# Patient Record
Sex: Female | Born: 1990 | Race: White | Hispanic: No | Marital: Single | Smoking: Former smoker
Health system: Southern US, Community
[De-identification: ages and names within clinical notes are randomized; demographics above are authoritative.]

## PROBLEM LIST (undated history)

## (undated) DIAGNOSIS — K589 Irritable bowel syndrome without diarrhea: Secondary | ICD-10-CM

## (undated) DIAGNOSIS — F419 Anxiety disorder, unspecified: Secondary | ICD-10-CM

## (undated) DIAGNOSIS — L309 Dermatitis, unspecified: Secondary | ICD-10-CM

## (undated) DIAGNOSIS — T783XXA Angioneurotic edema, initial encounter: Secondary | ICD-10-CM

## (undated) DIAGNOSIS — J45909 Unspecified asthma, uncomplicated: Secondary | ICD-10-CM

## (undated) DIAGNOSIS — F32A Depression, unspecified: Secondary | ICD-10-CM

## (undated) DIAGNOSIS — F329 Major depressive disorder, single episode, unspecified: Secondary | ICD-10-CM

## (undated) DIAGNOSIS — L659 Nonscarring hair loss, unspecified: Secondary | ICD-10-CM

## (undated) DIAGNOSIS — Z3201 Encounter for pregnancy test, result positive: Secondary | ICD-10-CM

## (undated) DIAGNOSIS — F17200 Nicotine dependence, unspecified, uncomplicated: Secondary | ICD-10-CM

## (undated) HISTORY — DX: Angioneurotic edema, initial encounter: T78.3XXA

## (undated) HISTORY — DX: Dermatitis, unspecified: L30.9

## (undated) HISTORY — PX: WISDOM TOOTH EXTRACTION: SHX21

## (undated) HISTORY — DX: Unspecified asthma, uncomplicated: J45.909

---

## 2013-04-22 ENCOUNTER — Encounter (HOSPITAL_COMMUNITY): Payer: Self-pay | Admitting: *Deleted

## 2013-04-22 ENCOUNTER — Emergency Department (HOSPITAL_COMMUNITY): Payer: Managed Care, Other (non HMO)

## 2013-04-22 ENCOUNTER — Emergency Department (HOSPITAL_COMMUNITY)
Admission: EM | Admit: 2013-04-22 | Discharge: 2013-04-23 | Disposition: A | Discharge status: Home / Self Care | Payer: Managed Care, Other (non HMO) | Attending: Emergency Medicine | Admitting: Emergency Medicine

## 2013-04-22 DIAGNOSIS — IMO0002 Reserved for concepts with insufficient information to code with codable children: Secondary | ICD-10-CM | POA: Insufficient documentation

## 2013-04-22 DIAGNOSIS — J40 Bronchitis, not specified as acute or chronic: Secondary | ICD-10-CM | POA: Insufficient documentation

## 2013-04-22 DIAGNOSIS — Z8739 Personal history of other diseases of the musculoskeletal system and connective tissue: Secondary | ICD-10-CM | POA: Insufficient documentation

## 2013-04-22 DIAGNOSIS — Z3202 Encounter for pregnancy test, result negative: Secondary | ICD-10-CM | POA: Insufficient documentation

## 2013-04-22 DIAGNOSIS — R062 Wheezing: Secondary | ICD-10-CM

## 2013-04-22 DIAGNOSIS — R05 Cough: Secondary | ICD-10-CM

## 2013-04-22 DIAGNOSIS — Z880 Allergy status to penicillin: Secondary | ICD-10-CM | POA: Insufficient documentation

## 2013-04-22 MED ORDER — IPRATROPIUM BROMIDE 0.02 % IN SOLN
0.5000 mg | Freq: Once | RESPIRATORY_TRACT | Status: AC
  Administered 2013-04-22: 0.5 mg via RESPIRATORY_TRACT
  Filled 2013-04-22: qty 2.5

## 2013-04-22 MED ORDER — ALBUTEROL SULFATE HFA 108 (90 BASE) MCG/ACT IN AERS: 2.0000 | INHALATION_SPRAY | RESPIRATORY_TRACT | Status: DC | PRN

## 2013-04-22 MED ORDER — BENZONATATE 100 MG PO CAPS: 100.0000 mg | ORAL_CAPSULE | Freq: Two times a day (BID) | ORAL | Status: DC | PRN

## 2013-04-22 MED ORDER — ALBUTEROL SULFATE (5 MG/ML) 0.5% IN NEBU
5.0000 mg | INHALATION_SOLUTION | Freq: Once | RESPIRATORY_TRACT | Status: AC
  Administered 2013-04-22: 5 mg via RESPIRATORY_TRACT
  Filled 2013-04-22: qty 1

## 2013-04-22 MED ORDER — BENZONATATE 100 MG PO CAPS
200.0000 mg | ORAL_CAPSULE | Freq: Once | ORAL | Status: AC
  Administered 2013-04-22: 200 mg via ORAL
  Filled 2013-04-22: qty 2

## 2013-04-22 NOTE — ED Notes (Signed)
Pt c/o SOB, Pts PCP treating her for last two day with bronchitis, PCP told Pt if symptoms didn't improve to come to ER.Brianna KitchenMarland Wilkinson

## 2013-04-22 NOTE — ED Provider Notes (Signed)
CSN: [id number]     Arrival date & time 04/22/13  2138 History   First MD Initiated Contact with Patient 04/22/13 2209     Chief Complaint  Patient presents with  . Shortness of Breath  . Cough   (Consider location/radiation/quality/duration/timing/severity/associated sxs/prior Treatment) Patient is a 22 y.o. female presenting with cough. The history is provided by the patient.  Cough Cough characteristics:  Non-productive, hoarse and harsh Severity:  Severe Onset quality:  Gradual Duration:  3 days Timing:  Constant Progression:  Worsening Chronicity:  New Context: upper respiratory infection   Relieved by:  Nothing Worsened by:  Deep breathing Ineffective treatments:  Cough suppressants (mucinex and throat menthol spray) Associated symptoms: shortness of breath and wheezing   Associated symptoms: no chest pain, no fever, no myalgias, no rash and no sore throat   Risk factors: recent infection (treated with antibiotics and prednisone taper by PCP)     Past Medical History  Diagnosis Date  . Arthritis    History reviewed. No pertinent past surgical history. No family history on file. History  Substance Use Topics  . Smoking status: Never Smoker   . Smokeless tobacco: Not on file  . Alcohol Use: No   OB History   Grav Para Term Preterm Abortions TAB SAB Ect Mult Living                 Review of Systems  Constitutional: Negative for fever, activity change and appetite change.  HENT: Negative for sore throat.   Respiratory: Positive for cough, shortness of breath and wheezing. Negative for chest tightness.   Cardiovascular: Negative for chest pain.  Gastrointestinal: Negative for nausea, vomiting, diarrhea and constipation.  Genitourinary: Negative for dysuria and difficulty urinating.  Musculoskeletal: Negative for myalgias.  Skin: Negative for color change, pallor, rash and wound.  All other systems reviewed and are negative.    Allergies  Pork-derived  products; Other; and Penicillins  Home Medications   Current Outpatient Rx  Name  Route  Sig  Dispense  Refill  . chlorpheniramine-HYDROcodone (TUSSIONEX) 10-8 MG/5ML LQCR   Oral   Take 5 mLs by mouth every 12 (twelve) hours as needed.         Marland Kitchen escitalopram (LEXAPRO) 10 MG tablet   Oral   Take 10 mg by mouth daily.         . Norgestimate-Ethinyl Estradiol Triphasic (TRINESSA, 28,) 0.18/0.215/0.25 MG-35 MCG tablet   Oral   Take 1 tablet by mouth daily.         . predniSONE (STERAPRED UNI-PAK) 10 MG tablet   Oral   Take 10 mg by mouth daily. 6,5,4,3,2,1         . sulfamethoxazole-trimethoprim (BACTRIM DS,SEPTRA DS) 800-160 MG per tablet   Oral   Take 1 tablet by mouth 2 (two) times daily. Started 04/22/13, for 7 days, ending 04/27/13         . albuterol (PROVENTIL HFA;VENTOLIN HFA) 108 (90 BASE) MCG/ACT inhaler   Inhalation   Inhale 2 puffs into the lungs every 4 (four) hours as needed for wheezing.   1 Inhaler   0   . benzonatate (TESSALON) 100 MG capsule   Oral   Take 1 capsule (100 mg total) by mouth 2 (two) times daily as needed for cough.   10 capsule   0    BP 114/56  Pulse 80  Temp(Src) 97.4 F (36.3 C) (Oral)  Resp 18  SpO2 95%  LMP 04/09/2013 Physical Exam  Nursing  note and vitals reviewed. Constitutional: She is oriented to person, place, and time. She appears well-developed and well-nourished. No distress.  HENT:  Head: Normocephalic and atraumatic.  Mouth/Throat: Oropharynx is clear and moist. No oropharyngeal exudate.  Eyes: Conjunctivae and EOM are normal. Pupils are equal, round, and reactive to light.  Neck: Normal range of motion. Neck supple.  Cardiovascular: Normal rate, regular rhythm and normal heart sounds.  Exam reveals no gallop and no friction rub.   No murmur heard. Pulmonary/Chest: Effort normal and breath sounds normal. No respiratory distress. She has no wheezes. She has no rales. She exhibits no tenderness.  Abdominal:  Soft. She exhibits no distension. There is no tenderness.  Musculoskeletal: Normal range of motion. She exhibits no edema and no tenderness.  Lymphadenopathy:    She has no cervical adenopathy.  Neurological: She is alert and oriented to person, place, and time.  Skin: Skin is warm and dry. No rash noted. She is not diaphoretic.  Psychiatric: She has a normal mood and affect. Her behavior is normal. Judgment and thought content normal.    ED Course  Procedures (including critical care time) Labs Review Labs Reviewed  POCT PREGNANCY, URINE   Imaging Review Dg Chest 2 View  04/22/2013   *RADIOLOGY REPORT*  Clinical Data: Shortness of breath and persistent cough.  CHEST - 2 VIEW  Comparison: None.  Findings: The lungs are well-aerated.  Minimal bibasilar opacities likely reflect atelectasis.  There is no evidence of pleural effusion or pneumothorax.  The heart is normal in size; the mediastinal contour is within normal limits.  No acute osseous abnormalities are seen.  IMPRESSION: Minimal bibasilar opacities likely reflect atelectasis; lungs otherwise clear.   Original Report Authenticated By: Tonia Ghent, M.D.    MDM   1. Bronchitis   2. Wheezing   3. Cough      Date: 04/22/2013  Rate: 84  Rhythm: normal sinus rhythm  QRS Axis: normal  Intervals: normal  ST/T Wave abnormalities: normal  Conduction Disutrbances:none  Narrative Interpretation:   Old EKG Reviewed: none available  The patient is a 22 year old female with a history of asthma as a child which has since resolved who presents with 2 days of worsening dyspnea, cough, headache. She was seen by her PCP 2 days ago with a chest x-ray with unknown results. She was started on a present taper as well as an unknown antibiotic. He continued dyspnea cough, she presents tonight with worsening symptoms. He states that she can only take shallow breaths because when she takes deep breaths she begins coughing which makes her catch her  breath. Denies fever, chest pain, nausea, vomiting.   On exam, moderate diffuse wheezing with decreased breath sounds and harsh bronchitis cough. No hypoxia and vital signs stable. PERC negative and doubt PE. Afebrile and no signs of pneumonia. Will evaluate with chest x-ray, EKG. Will give Tessalon for cough. Will treat with a breathing treatment per RT to attempt to improve breathing.  CXR shows no consolidation, effusion, pneumothorax, or widened mediastinum. EKG reassuring. Following duoneb, breathing much improved and wheezing completely resolved. Feel that bronchitis with bronchospasm most likely. Will treat going home with tessalon as well as continued Mucinex DM. Albuterol script given for wheezing PRN. Hemodynamically stable and otherwise okay for discharge. PCP follow up.  Discussed with the patient return precautions and need for follow up with PCP. Patient voiced understanding. Stable for d/c. This patient was discussed with my attending, Dr. Redgie Grayer.    Dorna Leitz,  MD 04/22/13 2348

## 2013-04-22 NOTE — Discharge Instructions (Signed)
Bronchitis Bronchitis is the body's way of reacting to injury and/or infection (inflammation) of the bronchi. Bronchi are the air tubes that extend from the windpipe into the lungs. If the inflammation becomes severe, it may cause shortness of breath. CAUSES  Inflammation may be caused by:  A virus.  Germs (bacteria).  Dust.  Allergens.  Pollutants and many other irritants. The cells lining the bronchial tree are covered with tiny hairs (cilia). These constantly beat upward, away from the lungs, toward the mouth. This keeps the lungs free of pollutants. When these cells become too irritated and are unable to do their job, mucus begins to develop. This causes the characteristic cough of bronchitis. The cough clears the lungs when the cilia are unable to do their job. Without either of these protective mechanisms, the mucus would settle in the lungs. Then you would develop pneumonia. Smoking is a common cause of bronchitis and can contribute to pneumonia. Stopping this habit is the single most important thing you can do to help yourself. TREATMENT   Your caregiver may prescribe an antibiotic if the cough is caused by bacteria. Also, medicines that open up your airways make it easier to breathe. Your caregiver may also recommend or prescribe an expectorant. It will loosen the mucus to be coughed up. Only take over-the-counter or prescription medicines for pain, discomfort, or fever as directed by your caregiver.  Removing whatever causes the problem (smoking, for example) is critical to preventing the problem from getting worse.  Cough suppressants may be prescribed for relief of cough symptoms.  Inhaled medicines may be prescribed to help with symptoms now and to help prevent problems from returning.  For those with recurrent (chronic) bronchitis, there may be a need for steroid medicines. SEEK IMMEDIATE MEDICAL CARE IF:   During treatment, you develop more pus-like mucus (purulent  sputum).  You have a fever.  Your baby is older than 3 months with a rectal temperature of 102 F (38.9 C) or higher.  Your baby is 28 months old or younger with a rectal temperature of 100.4 F (38 C) or higher.  You become progressively more ill.  You have increased difficulty breathing, wheezing, or shortness of breath. It is necessary to seek immediate medical care if you are elderly or sick from any other disease. MAKE SURE YOU:   Understand these instructions.  Will watch your condition.  Will get help right away if you are not doing well or get worse. Document Released: 07/05/2005 Document Revised: 09/27/2011 Document Reviewed: 05/14/2008 [facility] Patient Information 2014 [place], [place].  Metered Dose Inhaler with Spacer Inhaled medicines are the basis of treatment of asthma and other breathing problems. Inhaled medicine can only be effective if used properly. Good technique assures that the medicine reaches the lungs. Your caregiver has asked you to use a spacer with your inhaler. A spacer is a plastic tube with a mouthpiece on one end and an opening that connects to the inhaler on the other end. A spacer helps you take the medicine better. Metered dose inhalers (MDIs) are used to deliver a variety of inhaled medicines. These include quick relief medicines, controller medicines (such as corticosteroids), and cromolyn. The medicine is delivered by pushing down on a metal canister to release a set amount of spray. If you are using different kinds of inhalers, use your quick relief medicine to open the airways 10 to 15 minutes before using a steroid. If you are unsure which inhalers to use and the order of  using them, ask your caregiver, nurse, or respiratory therapist. STEPS TO FOLLOW USING AN INHALER WITH AN EXTENSION (SPACER): 1. Remove cap from inhaler. 2. Shake inhaler for 5 seconds before each inhalation (breathing in). 3. Place the open end of the spacer onto the mouthpiece  of the inhaler. 4. Position the inhaler so that the top of the canister faces up and the spacer mouthpiece faces you. 5. Put your index finger on the top of the medication canister. Your thumb supports the bottom of the inhaler and the spacer. 6. Exhale (breathe out) normally and as completely as possible. 7. Immediately after exhaling, place the spacer between your teeth and into your mouth. Close your mouth tightly around the spacer. 8. Press the canister down with the index finger to release the medication. 9. At the same time as the canister is pressed, inhale deeply and slowly until the lungs are completely filled. This should take 4 to 6 seconds. Keep your tongue down and out of the way. 10. Hold the medication in your lungs for up to 10 seconds (10 seconds is best). This helps the medicine get into the small airways of your lungs to work better. Exhale. 11. Repeat inhaling deeply through the spacer mouthpiece. Again hold that breath for up to 10 seconds (10 seconds is best). Exhale slowly. If it is difficult to take this second deep breath through the spacer, breathe normally several times through the spacer. Remove the spacer from your mouth. 12. Wait at least 1 minute between puffs. Continue with the above steps until you have taken the number of puffs your caregiver has ordered. 13. Remove spacer from the inhaler and place cap on inhaler. If you are using a steroid inhaler, rinse your mouth with water after your last puff and then spit out the water. DO NOT swallow the water. AVOID:  Inhaling before or after starting the spray of medicine. It takes practice to coordinate your breathing with triggering the spray.  Inhaling through the nose (rather than the mouth) when triggering the spray. HOW TO DETERMINE IF YOUR INHALER IS FULL OR NEARLY EMPTY:  Determine when an inhaler is empty. You cannot know when an inhaler is empty by shaking it. A few inhalers are now being made with dose  counters. Ask your caregiver for a prescription that has a dose counter if you feel you need that extra help.  If your inhaler does not have a counter, check the number of doses in the inhaler before you use it. The canister or box will list the number of doses in the canister. Divide the total number of doses in the canister by the number you will use each day to find how many days the canister will last. (For example, if your canister has 200 doses and you take 2 puffs, 4 times each day, which is 8 puffs a day. Dividing 200 by 8 equals 25. The canister should last 25 days.) Using a calendar, count forward that many days to see when your inhaler will run out. Write the refill date on a calendar or your canister.  Remember, if you need to take extra doses, the inhaler will empty sooner than you figured. Be sure you have a refill before your canister runs out. Refill your inhaler 7 to 10 days before it runs out. HOME CARE INSTRUCTIONS   Do not use the inhaler more than your caregiver tells you. If you are still wheezing and are feeling tightness in your chest, call your  caregiver.  Keep an adequate supply of medication. This includes making sure the medicine is not expired, and you have a spare inhaler.  Follow your caregiver or inhaler insert directions for cleaning the inhaler and spacer. SEEK MEDICAL CARE IF:   Symptoms are only partially relieved with your inhaler.  You are having trouble using your inhaler.  You experience some increase in phlegm.  You develop a fever of 102 F (38.9 C). SEEK IMMEDIATE MEDICAL CARE IF:   You feel little or no relief with your inhalers. You are still wheezing and are feeling shortness of breath or tightness in your chest.  If you have side effects such as dizziness, headaches or fast heart rate.  You have chills, fever, night sweats or an oral temperature above 102 F (38.9 C).  Phlegm production increases a lot, or there is blood in the  phlegm. MAKE SURE YOU:   Understand these instructions.  Will watch your condition.  Will get help right away if you are not doing well or get worse. Document Released: 07/05/2005 Document Revised: 01/04/2012 Document Reviewed: 04/22/2009 [facility] Patient Information 2014 [place], [place].

## 2013-04-22 NOTE — ED Notes (Signed)
Pt reports SOB, cough and headache.  Pt reports going to PCP and receiving medications including shots and was told is not better by today to come to ER.

## 2013-04-23 NOTE — ED Provider Notes (Signed)
I saw and evaluated the patient, reviewed the resident's note and I agree with the findings and plan.  I have reviewed EKG and CXR and agree with resident interpretation.  Bronchitis.  Doubt emergent etiology.  VSS. Benign exam.   Darlys Gales, MD 04/23/13 1154

## 2013-11-20 ENCOUNTER — Encounter (HOSPITAL_COMMUNITY): Payer: Self-pay | Admitting: Emergency Medicine

## 2013-11-20 ENCOUNTER — Emergency Department (HOSPITAL_COMMUNITY): Payer: Managed Care, Other (non HMO)

## 2013-11-20 ENCOUNTER — Emergency Department (HOSPITAL_COMMUNITY)
Admission: EM | Admit: 2013-11-20 | Discharge: 2013-11-20 | Disposition: A | Discharge status: Home / Self Care | Payer: Managed Care, Other (non HMO) | Attending: Emergency Medicine | Admitting: Emergency Medicine

## 2013-11-20 DIAGNOSIS — S1093XA Contusion of unspecified part of neck, initial encounter: Principal | ICD-10-CM

## 2013-11-20 DIAGNOSIS — Y92410 Unspecified street and highway as the place of occurrence of the external cause: Secondary | ICD-10-CM | POA: Insufficient documentation

## 2013-11-20 DIAGNOSIS — Y9389 Activity, other specified: Secondary | ICD-10-CM | POA: Insufficient documentation

## 2013-11-20 DIAGNOSIS — IMO0002 Reserved for concepts with insufficient information to code with codable children: Secondary | ICD-10-CM | POA: Diagnosis not present

## 2013-11-20 DIAGNOSIS — S0990XA Unspecified injury of head, initial encounter: Secondary | ICD-10-CM | POA: Diagnosis present

## 2013-11-20 DIAGNOSIS — S0003XA Contusion of scalp, initial encounter: Secondary | ICD-10-CM | POA: Diagnosis not present

## 2013-11-20 DIAGNOSIS — Z79899 Other long term (current) drug therapy: Secondary | ICD-10-CM | POA: Diagnosis not present

## 2013-11-20 DIAGNOSIS — Z880 Allergy status to penicillin: Secondary | ICD-10-CM | POA: Diagnosis not present

## 2013-11-20 DIAGNOSIS — S0083XA Contusion of other part of head, initial encounter: Principal | ICD-10-CM | POA: Insufficient documentation

## 2013-11-20 DIAGNOSIS — T148XXA Other injury of unspecified body region, initial encounter: Secondary | ICD-10-CM

## 2013-11-20 MED ORDER — CYCLOBENZAPRINE HCL 10 MG PO TABS: 10.0000 mg | ORAL_TABLET | Freq: Three times a day (TID) | ORAL | Status: DC | PRN

## 2013-11-20 MED ORDER — IBUPROFEN 800 MG PO TABS: 800.0000 mg | ORAL_TABLET | Freq: Three times a day (TID) | ORAL | Status: DC

## 2013-11-20 MED ORDER — IBUPROFEN 400 MG PO TABS
800.0000 mg | ORAL_TABLET | Freq: Once | ORAL | Status: AC
  Administered 2013-11-20: 800 mg via ORAL
  Filled 2013-11-20: qty 2
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
## 2013-11-20 NOTE — ED Notes (Signed)
Pt returned from X-ray.  
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
## 2013-11-24 NOTE — ED Provider Notes (Signed)
Medical screening examination/treatment/procedure(s) were performed by non-physician practitioner and as supervising physician I was immediately available for consultation/collaboration.   EKG Interpretation None        Rolland PorterMark Donnalyn Juran, MD 11/24/13 1920

## 2013-12-22 ENCOUNTER — Encounter (HOSPITAL_COMMUNITY): Payer: Self-pay | Admitting: Emergency Medicine

## 2013-12-22 ENCOUNTER — Emergency Department (HOSPITAL_COMMUNITY)
Admission: EM | Admit: 2013-12-22 | Discharge: 2013-12-22 | Disposition: A | Discharge status: Home / Self Care | Payer: Managed Care, Other (non HMO) | Attending: Emergency Medicine | Admitting: Emergency Medicine

## 2013-12-22 DIAGNOSIS — R112 Nausea with vomiting, unspecified: Secondary | ICD-10-CM

## 2013-12-22 DIAGNOSIS — Z880 Allergy status to penicillin: Secondary | ICD-10-CM | POA: Insufficient documentation

## 2013-12-22 DIAGNOSIS — K2960 Other gastritis without bleeding: Secondary | ICD-10-CM | POA: Insufficient documentation

## 2013-12-22 DIAGNOSIS — K2970 Gastritis, unspecified, without bleeding: Secondary | ICD-10-CM

## 2013-12-22 DIAGNOSIS — Z791 Long term (current) use of non-steroidal anti-inflammatories (NSAID): Secondary | ICD-10-CM | POA: Insufficient documentation

## 2013-12-22 DIAGNOSIS — Z79899 Other long term (current) drug therapy: Secondary | ICD-10-CM | POA: Insufficient documentation

## 2013-12-22 LAB — URINALYSIS, ROUTINE W REFLEX MICROSCOPIC
BILIRUBIN URINE: NEGATIVE
GLUCOSE, UA: NEGATIVE mg/dL
Hgb urine dipstick: NEGATIVE
KETONES UR: NEGATIVE mg/dL
Leukocytes, UA: NEGATIVE
NITRITE: NEGATIVE
PH: 6 (ref 5.0–8.0)
Protein, ur: NEGATIVE mg/dL
Specific Gravity, Urine: >1.03 — ABNORMAL HIGH (ref 1.005–1.030)
Urobilinogen, UA: 0.2 mg/dL (ref 0.0–1.0)

## 2013-12-22 LAB — LIPASE, BLOOD: Lipase: 33 U/L (ref 11–59)

## 2013-12-22 LAB — COMPREHENSIVE METABOLIC PANEL
ALT: 24 U/L (ref 0–35)
AST: 20 U/L (ref 0–37)
Albumin: 3.5 g/dL (ref 3.5–5.2)
Alkaline Phosphatase: 91 U/L (ref 39–117)
BILIRUBIN TOTAL: 0.2 mg/dL — AB (ref 0.3–1.2)
BUN: 10 mg/dL (ref 6–23)
CHLORIDE: 105 meq/L (ref 96–112)
CO2: 24 meq/L (ref 19–32)
Calcium: 9.4 mg/dL (ref 8.4–10.5)
Creatinine, Ser: 0.67 mg/dL (ref 0.50–1.10)
GFR calc Af Amer: >90 mL/min (ref >90)
Glucose, Bld: 125 mg/dL — ABNORMAL HIGH (ref 70–99)
Potassium: 4.6 mEq/L (ref 3.7–5.3)
Sodium: 141 mEq/L (ref 137–147)
Total Protein: 7 g/dL (ref 6.0–8.3)

## 2013-12-22 LAB — CBC WITH DIFFERENTIAL/PLATELET
BASOS ABS: 0 10*3/uL (ref 0.0–0.1)
Basophils Relative: 0 % (ref 0–1)
Eosinophils Absolute: 0.1 10*3/uL (ref 0.0–0.7)
Eosinophils Relative: 1 % (ref 0–5)
HCT: 38.1 % (ref 36.0–46.0)
Hemoglobin: 12.5 g/dL (ref 12.0–15.0)
LYMPHS ABS: 1.9 10*3/uL (ref 0.7–4.0)
LYMPHS PCT: 20 % (ref 12–46)
MCH: 29.4 pg (ref 26.0–34.0)
MCHC: 32.8 g/dL (ref 30.0–36.0)
MCV: 89.6 fL (ref 78.0–100.0)
MONO ABS: 0.6 10*3/uL (ref 0.1–1.0)
Monocytes Relative: 6 % (ref 3–12)
NEUTROS ABS: 6.6 10*3/uL (ref 1.7–7.7)
Neutrophils Relative %: 73 % (ref 43–77)
Platelets: 283 10*3/uL (ref 150–400)
RBC: 4.25 MIL/uL (ref 3.87–5.11)
RDW: 13.4 % (ref 11.5–15.5)
WBC: 9.1 10*3/uL (ref 4.0–10.5)

## 2013-12-22 LAB — PREGNANCY, URINE: PREG TEST UR: NEGATIVE

## 2013-12-22 MED ORDER — MORPHINE SULFATE 4 MG/ML IJ SOLN
4.0000 mg | Freq: Once | INTRAMUSCULAR | Status: AC
  Administered 2013-12-22: 4 mg via INTRAVENOUS
  Filled 2013-12-22: qty 1

## 2013-12-22 MED ORDER — ESOMEPRAZOLE MAGNESIUM 40 MG PO CPDR: 40.0000 mg | DELAYED_RELEASE_CAPSULE | Freq: Every day | ORAL | Status: DC

## 2013-12-22 MED ORDER — ONDANSETRON HCL 4 MG PO TABS: 4.0000 mg | ORAL_TABLET | Freq: Four times a day (QID) | ORAL | Status: DC

## 2013-12-22 MED ORDER — ONDANSETRON HCL 4 MG/2ML IJ SOLN
4.0000 mg | Freq: Once | INTRAMUSCULAR | Status: AC
  Administered 2013-12-22: 4 mg via INTRAVENOUS
  Filled 2013-12-22: qty 2

## 2013-12-22 NOTE — Discharge Instructions (Signed)

## 2013-12-22 NOTE — ED Notes (Signed)
The pt is c/o lt abd pain all day  No n v or diarrhea.  lmp  May 26

## 2013-12-22 NOTE — ED Provider Notes (Signed)
CSN: [id number]     Arrival date & time 12/22/13  0135 History   First MD Initiated Contact with Patient 12/22/13 [phone]     Chief Complaint  Patient presents with  . Abdominal Pain     (Consider location/radiation/quality/duration/timing/severity/associated sxs/prior Treatment) HPI Patient is a generally healthy young woman who presents with left upper quadrant abdominal pain. Her symptoms began SI, about 12 hours ago. She describes the pain as boring and aching. Nothing makes it worse or better. She has been nauseated throughout the night and vomited once after she arrived in the emergency department. She has not had diarrhea. No fever. No known ill contacts.  No history of similar symptoms. Pain is mild to moderate in severity. Nothing makes the pain worse or better although, the patient feels overall improved and vomited.   History reviewed. No pertinent past medical history. Past Surgical History  Procedure Laterality Date  . Wisdom tooth extraction     No family history on file. History  Substance Use Topics  . Smoking status: Never Smoker   . Smokeless tobacco: Not on file  . Alcohol Use: No   OB History   Grav Para Term Preterm Abortions TAB SAB Ect Mult Living                 Review of Systems Ten point review of symptoms performed and is negative with the exception of symptoms noted above.     Allergies  Other; Pork-derived products; and Penicillins  Home Medications   Prior to Admission medications   Medication Sig Start Date End Date Taking? Authorizing Provider  albuterol (PROVENTIL HFA;VENTOLIN HFA) 108 (90 BASE) MCG/ACT inhaler Inhale 2 puffs into the lungs every 4 (four) hours as needed for wheezing. 04/22/13  Yes Dorna Leitz, MD  escitalopram (LEXAPRO) 10 MG tablet Take 10 mg by mouth daily.   Yes Historical Provider, MD  fexofenadine (ALLEGRA) 180 MG tablet Take 180 mg by mouth daily.   Yes Historical Provider, MD  ibuprofen (ADVIL,MOTRIN) 800 MG tablet  Take 1 tablet (800 mg total) by mouth 3 (three) times daily. 11/20/13  Yes Phill Mutter Dammen, PA-C  norelgestromin-ethinyl estradiol (ORTHO EVRA) 150-35 MCG/24HR transdermal patch Place 1 patch onto the skin once a week. On Sundays   Yes Historical Provider, MD   BP 101/53  Pulse 69  Temp(Src) 98.5 F (36.9 C) (Oral)  Resp 19  SpO2 99%  LMP 12/11/2013 Physical Exam Gen: well developed and well nourished appearing Head: NCAT Eyes: PERL, EOMI Nose: no epistaixis or rhinorrhea Mouth/throat: mucosa is moist and pink Neck: supple, no stridor Lungs: CTA B, no wheezing, rhonchi or rales CV: RRR, no murmur, extremities appear well perfused.  Abd: soft, notender, nondistended Back: no ttp, no cva ttp Skin: warm and dry Ext: normal to inspection, no dependent edema Neuro: CN ii-xii grossly intact, no focal deficits Psyche; normal affect,  calm and cooperative.   ED Course  Procedures (including critical care time) Labs Review  Results for orders placed during the hospital encounter of 12/22/13 (from the past 24 hour(s))  PREGNANCY, URINE     Status: None   Collection Time    12/22/13  1:56 AM      Result Value Ref Range   Preg Test, Ur NEGATIVE  NEGATIVE  URINALYSIS, ROUTINE W REFLEX MICROSCOPIC     Status: Abnormal   Collection Time    12/22/13  1:56 AM      Result Value Ref Range   Color, Urine  YELLOW  YELLOW   APPearance CLEAR  CLEAR   Specific Gravity, Urine >1.030 (*) 1.005 - 1.030   pH 6.0  5.0 - 8.0   Glucose, UA NEGATIVE  NEGATIVE mg/dL   Hgb urine dipstick NEGATIVE  NEGATIVE   Bilirubin Urine NEGATIVE  NEGATIVE   Ketones, ur NEGATIVE  NEGATIVE mg/dL   Protein, ur NEGATIVE  NEGATIVE mg/dL   Urobilinogen, UA 0.2  0.0 - 1.0 mg/dL   Nitrite NEGATIVE  NEGATIVE   Leukocytes, UA NEGATIVE  NEGATIVE  CBC WITH DIFFERENTIAL     Status: None   Collection Time    12/22/13  2:01 AM      Result Value Ref Range   WBC 9.1  4.0 - 10.5 K/uL   RBC 4.25  3.87 - 5.11 MIL/uL   Hemoglobin  12.5  12.0 - 15.0 g/dL   HCT 59.9  35.7 - 01.7 %   MCV 89.6  78.0 - 100.0 fL   MCH 29.4  26.0 - 34.0 pg   MCHC 32.8  30.0 - 36.0 g/dL   RDW 79.3  90.3 - 00.9 %   Platelets 283  150 - 400 K/uL   Neutrophils Relative % 73  43 - 77 %   Neutro Abs 6.6  1.7 - 7.7 K/uL   Lymphocytes Relative 20  12 - 46 %   Lymphs Abs 1.9  0.7 - 4.0 K/uL   Monocytes Relative 6  3 - 12 %   Monocytes Absolute 0.6  0.1 - 1.0 K/uL   Eosinophils Relative 1  0 - 5 %   Eosinophils Absolute 0.1  0.0 - 0.7 K/uL   Basophils Relative 0  0 - 1 %   Basophils Absolute 0.0  0.0 - 0.1 K/uL  COMPREHENSIVE METABOLIC PANEL     Status: Abnormal   Collection Time    12/22/13  2:01 AM      Result Value Ref Range   Sodium 141  137 - 147 mEq/L   Potassium 4.6  3.7 - 5.3 mEq/L   Chloride 105  96 - 112 mEq/L   CO2 24  19 - 32 mEq/L   Glucose, Bld 125 (*) 70 - 99 mg/dL   BUN 10  6 - 23 mg/dL   Creatinine, Ser 2.33  0.50 - 1.10 mg/dL   Calcium 9.4  8.4 - 00.7 mg/dL   Total Protein 7.0  6.0 - 8.3 g/dL   Albumin 3.5  3.5 - 5.2 g/dL   AST 20  0 - 37 U/L   ALT 24  0 - 35 U/L   Alkaline Phosphatase 91  39 - 117 U/L   Total Bilirubin 0.2 (*) 0.3 - 1.2 mg/dL   GFR calc non Af Amer >90  >90 mL/min   GFR calc Af Amer >90  >90 mL/min  LIPASE, BLOOD     Status: None   Collection Time    12/22/13  2:01 AM      Result Value Ref Range   Lipase 33  11 - 59 U/L     MDM   ED work up is nondiagnostic and reassuring. Patient has normal VS, benign abdominal exam. Actually was pain free at the time of my assessment. Normal LFTs and lipase exclude acute hepatitis and pancreatitis as cause of sx. Suspect sx secondary to gastroenteritis v. Gastritis v. PUD.    Patient is able to tolerate po. We will initiate treatment with PPI. Patient is stable for d/c with return precautions and plan  to f/u with PCP.    Brandt LoosenJulie Manly, MD 12/22/13 [phone]

## 2014-04-20 ENCOUNTER — Emergency Department (HOSPITAL_COMMUNITY)
Admission: EM | Admit: 2014-04-20 | Discharge: 2014-04-21 | Disposition: A | Discharge status: Home / Self Care | Payer: Managed Care, Other (non HMO) | Attending: Emergency Medicine | Admitting: Emergency Medicine

## 2014-04-20 ENCOUNTER — Encounter (HOSPITAL_COMMUNITY): Payer: Self-pay | Admitting: Emergency Medicine

## 2014-04-20 DIAGNOSIS — J209 Acute bronchitis, unspecified: Secondary | ICD-10-CM | POA: Diagnosis not present

## 2014-04-20 DIAGNOSIS — R079 Chest pain, unspecified: Secondary | ICD-10-CM | POA: Diagnosis not present

## 2014-04-20 DIAGNOSIS — Z79899 Other long term (current) drug therapy: Secondary | ICD-10-CM | POA: Insufficient documentation

## 2014-04-20 DIAGNOSIS — Z880 Allergy status to penicillin: Secondary | ICD-10-CM | POA: Insufficient documentation

## 2014-04-20 DIAGNOSIS — R000 Tachycardia, unspecified: Secondary | ICD-10-CM | POA: Diagnosis not present

## 2014-04-20 DIAGNOSIS — Z720 Tobacco use: Secondary | ICD-10-CM | POA: Insufficient documentation

## 2014-04-20 DIAGNOSIS — Z791 Long term (current) use of non-steroidal anti-inflammatories (NSAID): Secondary | ICD-10-CM | POA: Diagnosis not present

## 2014-04-20 DIAGNOSIS — R0602 Shortness of breath: Secondary | ICD-10-CM | POA: Diagnosis present

## 2014-04-20 MED ORDER — IPRATROPIUM-ALBUTEROL 0.5-2.5 (3) MG/3ML IN SOLN
3.0000 mL | Freq: Once | RESPIRATORY_TRACT | Status: AC
  Administered 2014-04-21: 3 mL via RESPIRATORY_TRACT
  Filled 2014-04-20: qty 3

## 2014-04-20 NOTE — ED Notes (Signed)
Pt presents with c/o chest pain and shortness of breath. Pt reports that she had a steroid shot earlier today and after the shot she felt ok but into this evening she started to have some chest pain, shortness of breath, and weakness. Pt says the chest pain is in the central area of her chest. Pt is speaking in c/o sentences at this time, NAD.

## 2014-04-21 ENCOUNTER — Emergency Department (HOSPITAL_COMMUNITY): Payer: Managed Care, Other (non HMO)

## 2014-04-21 NOTE — ED Provider Notes (Signed)
Medical screening examination/treatment/procedure(s) were performed by non-physician practitioner and as supervising physician I was immediately available for consultation/collaboration.   EKG Interpretation   Date/Time:  Saturday April 20 2014 23:24:11 EDT Ventricular Rate:  93 PR Interval:  126 QRS Duration: 85 QT Interval:  341 QTC Calculation: 424 R Axis:   79 Text Interpretation:  Sinus rhythm Confirmed by [facility]  MD, Palmer Shorey  ([id number]) on 04/20/2014 11:37:50 PM       Brianna Wilkinson Smitty CordsK Dinesha Twiggs-Rasch, MD 04/21/14 [phone]

## 2014-04-21 NOTE — Discharge Instructions (Signed)
Acute Bronchitis Bronchitis is when the airways that extend from the windpipe into the lungs get red, puffy, and painful (inflamed). Bronchitis often causes thick spit (mucus) to develop. This leads to a cough. A cough is the most common symptom of bronchitis. In acute bronchitis, the condition usually begins suddenly and goes away over time (usually in 2 weeks). Smoking, allergies, and asthma can make bronchitis worse. Repeated episodes of bronchitis may cause more lung problems. HOME CARE  Rest.  Drink enough fluids to keep your pee (urine) clear or pale yellow (unless you need to limit fluids as told by your doctor).  Only take over-the-counter or prescription medicines as told by your doctor.  Avoid smoking and secondhand smoke. These can make bronchitis worse. If you are a smoker, think about using nicotine gum or skin patches. Quitting smoking will help your lungs heal faster.  Reduce the chance of getting bronchitis again by:  Washing your hands often.  Avoiding people with cold symptoms.  Trying not to touch your hands to your mouth, nose, or eyes.  Follow up with your doctor as told. GET HELP IF: Your symptoms do not improve after 1 week of treatment. Symptoms include:  Cough.  Fever.  Coughing up thick spit.  Body aches.  Chest congestion.  Chills.  Shortness of breath.  Sore throat. GET HELP RIGHT AWAY IF:   You have an increased fever.  You have chills.  You have severe shortness of breath.  You have bloody thick spit (sputum).  You throw up (vomit) often.  You lose too much body fluid (dehydration).  You have a severe headache.  You faint. MAKE SURE YOU:   Understand these instructions.  Will watch your condition.  Will get help right away if you are not doing well or get worse. Document Released: 12/22/2007 Document Revised: 03/07/2013 Document Reviewed: 12/26/2012 [facility] Patient Information 2015 [place]. This information is not  intended to replace advice given to you by your health care provider. Make sure you discuss any questions you have with your health care provider. The next 2 days.  I would like you use your inhaler on a regular basis, 2 puffs every 4-6 hours while awake, then as needed.  Thereafter, your chest x-ray is normal

## 2014-04-21 NOTE — ED Provider Notes (Signed)
CSN: [id number]     Arrival date & time 04/20/14  2310 History   First MD Initiated Contact with Patient 04/20/14 2336     Chief Complaint  Patient presents with  . Chest Pain  . Shortness of Breath  . Possible allergic reaction      (Consider location/radiation/quality/duration/timing/severity/associated sxs/prior Treatment) HPI Comments: " I had asthma as a child"  But need albuterol with URI  Used inhaler X2 today  Patient states she was seen by her primary care physician today and was given a shot of prednisone for her bronchitis.  Patient is a 23 y.o. female presenting with chest pain and shortness of breath. The history is provided by the patient.  Chest Pain Pain location:  Unable to specify Pain quality: dull   Pain radiates to:  Does not radiate Pain radiates to the back: no   Pain severity:  Mild Onset quality:  Gradual Timing:  Constant Progression:  Unchanged Chronicity:  New Context: breathing   Relieved by:  Nothing Worsened by:  Nothing tried Ineffective treatments:  None tried Associated symptoms: cough and shortness of breath   Associated symptoms: no dizziness, no nausea and no numbness   Cough:    Cough characteristics:  Non-productive   Sputum characteristics:  Nondescript   Severity:  Mild Shortness of breath:    Severity:  Mild   Onset quality:  Gradual   Progression:  Unchanged Risk factors: no birth control, no diabetes mellitus, not pregnant, no prior DVT/PE, no smoking and no surgery   Shortness of Breath Associated symptoms: chest pain and cough     History reviewed. No pertinent past medical history. Past Surgical History  Procedure Laterality Date  . Wisdom tooth extraction     No family history on file. History  Substance Use Topics  . Smoking status: Current Every Day Smoker  . Smokeless tobacco: Not on file  . Alcohol Use: No   OB History   Grav Para Term Preterm Abortions TAB SAB Ect Mult Living                 Review of  Systems  HENT: Negative for rhinorrhea.   Respiratory: Positive for cough and shortness of breath.   Cardiovascular: Positive for chest pain.  Gastrointestinal: Negative for nausea.  Musculoskeletal: Negative for myalgias.  Neurological: Negative for dizziness and numbness.  All other systems reviewed and are negative.     Allergies  Other; Pork-derived products; and Penicillins  Home Medications   Prior to Admission medications   Medication Sig Start Date End Date Taking? Authorizing Provider  albuterol (PROVENTIL HFA;VENTOLIN HFA) 108 (90 BASE) MCG/ACT inhaler Inhale 2 puffs into the lungs every 4 (four) hours as needed for wheezing. 04/22/13  Yes Dorna Leitz, MD  amphetamine-dextroamphetamine (ADDERALL XR) 20 MG 24 hr capsule Take 20 mg by mouth daily as needed (focus).   Yes Historical Provider, MD  escitalopram (LEXAPRO) 20 MG tablet Take 20 mg by mouth daily.   Yes Historical Provider, MD  fexofenadine (ALLEGRA) 180 MG tablet Take 180 mg by mouth daily.   Yes Historical Provider, MD  ibuprofen (ADVIL,MOTRIN) 800 MG tablet Take 1 tablet (800 mg total) by mouth 3 (three) times daily. 11/20/13  Yes Phill Mutter Dammen, PA-C  phentermine 37.5 MG capsule Take 37.5 mg by mouth every morning.   Yes Historical Provider, MD  Pseudoeph-Doxylamine-DM-APAP 30-6.25-15-325 MG CAPS Take 2 capsules by mouth at bedtime as needed (for cold symptoms).   Yes Historical Provider, MD  BP 121/64  Pulse 114  Temp(Src) 98 F (36.7 C) (Oral)  Resp 19  Ht 5\' 3"  (1.6 m)  Wt 166 lb (75.297 kg)  BMI 29.41 kg/m2  SpO2 96%  LMP 04/20/2014 Physical Exam  Nursing note and vitals reviewed. Constitutional: She is oriented to person, place, and time. She appears well-developed and well-nourished.  Eyes: Pupils are equal, round, and reactive to light.  Neck: Normal range of motion.  Cardiovascular: Regular rhythm.  Tachycardia present.   Abdominal: Soft. She exhibits no distension. There is no tenderness.   Musculoskeletal: Normal range of motion.  Neurological: She is alert and oriented to person, place, and time.  Skin: Skin is warm and dry. No rash noted. No erythema.    ED Course  Procedures (including critical care time) Labs Review Labs Reviewed - No data to display  Imaging Review Dg Chest 2 View  04/21/2014   CLINICAL DATA:  Chest pain, shortness of breath.  EXAM: CHEST  2 VIEW  COMPARISON:  Chest radiograph 04/22/2013  FINDINGS: Stable cardiac and mediastinal contours. No consolidative pulmonary opacities. No pleural effusion or pneumothorax. Regional skeleton is unremarkable.  IMPRESSION: No acute cardiopulmonary process.   Electronically Signed   By: Annia Beltrew  Davis M.D.   On: 04/21/2014 01:42     EKG Interpretation   Date/Time:  Saturday April 20 2014 23:24:11 EDT Ventricular Rate:  93 PR Interval:  126 QRS Duration: 85 QT Interval:  341 QTC Calculation: 424 R Axis:   79 Text Interpretation:  Sinus rhythm Confirmed by [facility]  MD, APRIL  ([id number]) on 04/20/2014 11:37:50 PM     Wells Criteria score 1.5 low risk Chest x-ray is normal.  I think her recent use her inhaler, 2 puffs every 4-6 hours while awake for the next 2 days on a regular basis, and, then as needed.  Thereafter MDM   Final diagnoses:  Bronchitis, acute, with bronchospasm         Arman FilterGail K Marionna Gonia, NP 04/21/14 [phone]

## 2016-02-20 ENCOUNTER — Encounter (HOSPITAL_COMMUNITY): Payer: Self-pay | Admitting: Emergency Medicine

## 2016-02-20 ENCOUNTER — Emergency Department (HOSPITAL_COMMUNITY)
Admission: EM | Admit: 2016-02-20 | Discharge: 2016-02-21 | Disposition: A | Discharge status: Other Acute Inpatient Hospital | Payer: Managed Care, Other (non HMO) | Attending: Emergency Medicine | Admitting: Emergency Medicine

## 2016-02-20 DIAGNOSIS — R4689 Other symptoms and signs involving appearance and behavior: Secondary | ICD-10-CM

## 2016-02-20 DIAGNOSIS — Z5181 Encounter for therapeutic drug level monitoring: Secondary | ICD-10-CM | POA: Diagnosis not present

## 2016-02-20 DIAGNOSIS — Y999 Unspecified external cause status: Secondary | ICD-10-CM | POA: Diagnosis not present

## 2016-02-20 DIAGNOSIS — F17210 Nicotine dependence, cigarettes, uncomplicated: Secondary | ICD-10-CM | POA: Diagnosis not present

## 2016-02-20 DIAGNOSIS — Y929 Unspecified place or not applicable: Secondary | ICD-10-CM | POA: Insufficient documentation

## 2016-02-20 DIAGNOSIS — Y939 Activity, unspecified: Secondary | ICD-10-CM | POA: Insufficient documentation

## 2016-02-20 DIAGNOSIS — R4589 Other symptoms and signs involving emotional state: Secondary | ICD-10-CM

## 2016-02-20 DIAGNOSIS — X789XXA Intentional self-harm by unspecified sharp object, initial encounter: Secondary | ICD-10-CM | POA: Diagnosis not present

## 2016-02-20 DIAGNOSIS — R45851 Suicidal ideations: Secondary | ICD-10-CM

## 2016-02-20 DIAGNOSIS — Z23 Encounter for immunization: Secondary | ICD-10-CM | POA: Insufficient documentation

## 2016-02-20 DIAGNOSIS — S61511A Laceration without foreign body of right wrist, initial encounter: Secondary | ICD-10-CM

## 2016-02-20 HISTORY — DX: Depression, unspecified: F32.A

## 2016-02-20 HISTORY — DX: Irritable bowel syndrome, unspecified: K58.9

## 2016-02-20 HISTORY — DX: Anxiety disorder, unspecified: F41.9

## 2016-02-20 HISTORY — DX: Major depressive disorder, single episode, unspecified: F32.9

## 2016-02-20 LAB — CBC
HCT: 44.8 % (ref 36.0–46.0)
HEMOGLOBIN: 15.2 g/dL — AB (ref 12.0–15.0)
MCH: 31.5 pg (ref 26.0–34.0)
MCHC: 33.9 g/dL (ref 30.0–36.0)
MCV: 92.9 fL (ref 78.0–100.0)
Platelets: 235 10*3/uL (ref 150–400)
RBC: 4.82 MIL/uL (ref 3.87–5.11)
RDW: 12.8 % (ref 11.5–15.5)
WBC: 6.5 10*3/uL (ref 4.0–10.5)

## 2016-02-20 LAB — COMPREHENSIVE METABOLIC PANEL
ALT: 15 U/L (ref 14–54)
AST: 16 U/L (ref 15–41)
Albumin: 4.8 g/dL (ref 3.5–5.0)
Alkaline Phosphatase: 56 U/L (ref 38–126)
Anion gap: 5 (ref 5–15)
BUN: 6 mg/dL (ref 6–20)
CALCIUM: 9 mg/dL (ref 8.9–10.3)
CHLORIDE: 109 mmol/L (ref 101–111)
CO2: 25 mmol/L (ref 22–32)
Creatinine, Ser: 0.53 mg/dL (ref 0.44–1.00)
GFR calc Af Amer: >60 mL/min (ref >60)
Glucose, Bld: 94 mg/dL (ref 65–99)
Potassium: 3.5 mmol/L (ref 3.5–5.1)
Sodium: 139 mmol/L (ref 135–145)
Total Bilirubin: 0.9 mg/dL (ref 0.3–1.2)
Total Protein: 7.5 g/dL (ref 6.5–8.1)

## 2016-02-20 LAB — RAPID URINE DRUG SCREEN, HOSP PERFORMED
AMPHETAMINES: NOT DETECTED
BENZODIAZEPINES: NOT DETECTED
Barbiturates: NOT DETECTED
Cocaine: NOT DETECTED
Opiates: NOT DETECTED
TETRAHYDROCANNABINOL: POSITIVE — AB

## 2016-02-20 LAB — ETHANOL: Alcohol, Ethyl (B): <5 mg/dL (ref <5)

## 2016-02-20 LAB — SALICYLATE LEVEL: Salicylate Lvl: <4 mg/dL (ref 2.8–30.0)

## 2016-02-20 LAB — ACETAMINOPHEN LEVEL: Acetaminophen (Tylenol), Serum: <10 ug/mL — ABNORMAL LOW (ref 10–30)

## 2016-02-20 LAB — POC URINE PREG, ED: PREG TEST UR: NEGATIVE

## 2016-02-20 MED ORDER — ONDANSETRON HCL 4 MG PO TABS
4.0000 mg | ORAL_TABLET | Freq: Three times a day (TID) | ORAL | Status: DC | PRN
Start: 2016-02-20 — End: 2016-02-21

## 2016-02-20 MED ORDER — ALBUTEROL SULFATE HFA 108 (90 BASE) MCG/ACT IN AERS: 2.0000 | INHALATION_SPRAY | RESPIRATORY_TRACT | Status: DC | PRN

## 2016-02-20 MED ORDER — ALUM & MAG HYDROXIDE-SIMETH 200-200-20 MG/5ML PO SUSP: 30.0000 mL | ORAL | Status: DC | PRN

## 2016-02-20 MED ORDER — DICYCLOMINE HCL 20 MG PO TABS: 20.0000 mg | ORAL_TABLET | Freq: Four times a day (QID) | ORAL | Status: DC | PRN

## 2016-02-20 MED ORDER — AMPHETAMINE-DEXTROAMPHETAMINE 20 MG PO TABS: 20.0000 mg | ORAL_TABLET | Freq: Every day | ORAL | Status: DC | PRN

## 2016-02-20 MED ORDER — CALCIUM CARBONATE-VITAMIN D 500-200 MG-UNIT PO TABS
1.0000 | ORAL_TABLET | Freq: Every day | ORAL | Status: DC
  Filled 2016-02-20: qty 1

## 2016-02-20 MED ORDER — ZOLPIDEM TARTRATE 5 MG PO TABS: 5.0000 mg | ORAL_TABLET | Freq: Every evening | ORAL | Status: DC | PRN

## 2016-02-20 MED ORDER — IBUPROFEN 200 MG PO TABS: 600.0000 mg | ORAL_TABLET | Freq: Three times a day (TID) | ORAL | Status: DC | PRN

## 2016-02-20 MED ORDER — TETANUS-DIPHTH-ACELL PERTUSSIS 5-2.5-18.5 LF-MCG/0.5 IM SUSP
0.5000 mL | Freq: Once | INTRAMUSCULAR | Status: AC
  Administered 2016-02-20: 0.5 mL via INTRAMUSCULAR
  Filled 2016-02-20: qty 0.5

## 2016-02-20 MED ORDER — NICOTINE 21 MG/24HR TD PT24
21.0000 mg | MEDICATED_PATCH | Freq: Once | TRANSDERMAL | Status: DC
  Administered 2016-02-20: 21 mg via TRANSDERMAL
  Filled 2016-02-20: qty 1

## 2016-02-20 MED ORDER — LORAZEPAM 1 MG PO TABS: 1.0000 mg | ORAL_TABLET | Freq: Three times a day (TID) | ORAL | Status: DC | PRN

## 2016-02-20 MED ORDER — ESCITALOPRAM OXALATE 10 MG PO TABS
20.0000 mg | ORAL_TABLET | Freq: Every day | ORAL | Status: DC
  Administered 2016-02-20: 20 mg via ORAL
  Filled 2016-02-20: qty 2

## 2016-02-20 MED ORDER — ACETAMINOPHEN 325 MG PO TABS: 650.0000 mg | ORAL_TABLET | ORAL | Status: DC | PRN

## 2016-02-20 MED ORDER — CALCIUM CARBONATE-VITAMIN D 250-125 MG-UNIT PO TABS: 1.0000 | ORAL_TABLET | Freq: Every day | ORAL | Status: DC

## 2016-02-20 NOTE — ED Notes (Signed)
Pt can go to [facility] around 2300

## 2016-02-20 NOTE — BH Assessment (Signed)
Patient states that she is willing to sign herself in voluntarily. Contacted AC Brooke at [facility] who has assigned patient to [id number] after 11:30PM. Patient will be transported by [facility] due to voluntary status. Call report [phone] before patient is transported. Informed EDP of disposition.   Davina Poke, LCSW Therapeutic Triage Specialist [facility] 02/20/2016 9:28 PM

## 2016-02-20 NOTE — ED Notes (Addendum)
Pt is going to 403 bed 1 at [facility], Report given to [facility]

## 2016-02-20 NOTE — BH Assessment (Signed)
Patient reviewed and signed voluntary consent to treat and ROI to no one. Patient asked if she could have her blanket and teddy bear. Patient states that she "is worried about staying forever." Patient was informed of the process and informed patient that she would have access to a psychiatrist in the morning who is available to answer her questions and address her concerns. Patient states that she does not want to be "turned involuntary" and was informed that if she is agreeable to treatment recommended she will remain voluntary and that she should not be placed under IVC without her knowledge. Patient was also informed of the PHP program and states that she would be interested in that program as well.  Patient denies other questions or concerns.   Patients paperwork faxed to [facility] and provided to patients nurse to transport with patient via [facility] after 1130pm.   Davina Poke, LCSW Therapeutic Triage Specialist [facility] 02/20/2016 10:02 PM

## 2016-02-20 NOTE — ED Provider Notes (Signed)
By signing my name below, I, Rosario Adie, attest that this documentation has been prepared under the direction and in the presence of [employer], PA-C.  Electronically Signed: Rosario Adie, ED Scribe. 02/20/16. 9:18 PM.   Reevaluated w/ pt, and discussed options for staying w/ BH. She reports that she is no longer having these thoughts, and was "having a low moment". States her ideation came on today while thinking of her recent miscarriage and while she was alone; prompting her attempted suicide. Patient agreed to stay w/ [facility] compliantly.     I personally performed the services described in this documentation, which was scribed in my presence. The recorded information has been reviewed and is accurate.    Eyvonne Mechanic, PA-C 02/20/16 2232    Laurence Spates, MD 02/20/16 [phone]

## 2016-02-20 NOTE — BH Assessment (Signed)
Assessment completed. Consulted with Donell Sievert, PA-C who recommends inpatient treatment at this time. He recommends that patient be placed under IVC if she is not willing to sign in voluntarily.   Davina Poke, LCSW Therapeutic Triage Specialist [facility] 02/20/2016 8:45 PM

## 2016-02-20 NOTE — ED Notes (Signed)
Brianna Wilkinson has been called. Pt's paperwork is at triage desk

## 2016-02-20 NOTE — BH Assessment (Addendum)
Assessment Note  Brianna Wilkinson is an 25 y.o. female presenting voluntarily to [facility] for suicidal ideations and lacerations to her right wrist. Patient states that she still feels suicidal but no longer has a plan. Patient states that she has a history of self injurious behaviors by cutting. Patient states that she usually cuts "to feel something so I won't feel numb." Patient states that she previously cut this Tuesday which usually makes her feel better and states "it didn't help like it usually does." Patient states that she has thought about suicide previously but never acted on it. Patient states that four years ago she planned to overdose but decided not to follow through with it. Patient states that she feels depressed due to recent stressors. Patient states her recent stressors as; having a miscarriage last week, moving twice in one month due to her previous roommate being a "heroin addict," her boyfriend ending their relationship and "sent discouraging text messages last night," stating that she was discouraged because she thought they would "work things out." She states that her best friend is moving to [place] with her previous ex-boyfriend and states "they are like my second family." Patient states that her family lives far away and states that the closest family member is three and a half hours away. Patient states that her mother committed suicide when she was [age] years of age. She states that when her best friends moves away she will not have any support. Patient states "I guess it finally hit me with everything that happened this week and I didn't want to do it anymore." Patient states "I've been feeling alone because I lost my boyfriend and some friends and my family is three and a half to nine hours away." Patient endorses symptoms of depression as.; insomnia, tearfulness, fatigue, anhedonia, feelings worthless and helpless, irritability, and loss of appetite which resulted in losing 8  pounds in the past week.   Patient denies HI and history of aggression. Patient denies access to firearms. Patient states that she has court for trespassing at the pool in her apartment complex and has a court date later this month. Patient denies AVH and does not appear to be responding to internal stimuli during the assessment. Patient states that she uses THC occasionally "when I'm having a really bad day" and last used two days ago. Patient denies other drug use. Patient BAL <5 and UDS + THC at time of assessment.   Consulted with Donell Sievert, PA-C who recommends inpatient treatment.   Diagnosis: major Depressive Disorder, Single Episode, Severe  Past Medical History:  Past Medical History:  Diagnosis Date  . Anxiety   . Depression   . IBS (irritable bowel syndrome)     Past Surgical History:  Procedure Laterality Date  . WISDOM TOOTH EXTRACTION      Family History: No family history on file.  Social History:  reports that she has been smoking.  She has never used smokeless tobacco. She reports that she does not drink alcohol or use drugs.  Additional Social History:  Alcohol / Drug Use Pain Medications: Denies Prescriptions: Denies Over the Counter: Denies History of alcohol / drug use?: Yes Substance #1 Name of Substance 1: THC 1 - Age of First Use: 17 1 - Amount (size/oz): "a bowl back" 1 - Frequency: "not very often" 1 - Duration: ongoing 1 - Last Use / Amount: 2 days ago  CIWA: CIWA-Ar Pulse Rate: 71 COWS:    Allergies:  Allergies  Allergen Reactions  .  Other Diarrhea    Green [employer], Spicy foods, Pork   . Pork-Derived Products Diarrhea    IBS   . Penicillins Other (See Comments)    unknown    Home Medications:  (Not in a hospital admission)  OB/GYN Status:  Patient's last menstrual period was 02/15/2016.  General Assessment Data Location of Assessment: [facility] TTS Assessment: In system Is this a Tele or Face-to-Face Assessment?:  Face-to-Face Is this an Initial Assessment or a Re-assessment for this encounter?: Initial Assessment Marital status: Single Is patient pregnant?: No Pregnancy Status: No Living Arrangements: Alone Can pt return to current living arrangement?: Yes Admission Status: Voluntary Is patient capable of signing voluntary admission?: Yes Referral Source: Self/Family/Friend Insurance type: Community education officer     Crisis Care Plan Living Arrangements: Alone Name of Psychiatrist: None Name of Therapist: None  Education Status Is patient currently in school?: Yes Highest grade of school patient has completed: 23 [employer] [place]) Name of school: [employer]  Risk to self with the past 6 months Suicidal Ideation: Yes-Currently Present Has patient been a risk to self within the past 6 months prior to admission? : No Suicidal Intent: Yes-Currently Present Has patient had any suicidal intent within the past 6 months prior to admission? : No Is patient at risk for suicide?: Yes Suicidal Plan?: Yes-Currently Present Has patient had any suicidal plan within the past 6 months prior to admission? : No Specify Current Suicidal Plan: Cut wrist Access to Means: Yes Specify Access to Suicidal Means: razor blade What has been your use of drugs/alcohol within the last 12 months?: THC  Previous Attempts/Gestures: No How many times?: 0 (planned to overdose four years ago but did not ) Other Self Harm Risks: cutting Triggers for Past Attempts: Other (Comment) (relationship) Intentional Self Injurious Behavior: Cutting Comment - Self Injurious Behavior: cutting (since age [age] "to feel something") Family Suicide History: Yes (mother) Recent stressful life event(s): Other (Comment) (moved recently, tresspassing, miscarriage, breakup, ) Persecutory voices/beliefs?: No Depression: Yes Depression Symptoms: Insomnia, Tearfulness, Fatigue, Loss of interest in usual pleasures, Feeling worthless/self pity, Feeling  angry/irritable Substance abuse history and/or treatment for substance abuse?: Yes Suicide prevention information given to non-admitted patients: Not applicable  Risk to Others within the past 6 months Homicidal Ideation: No Does patient have any lifetime risk of violence toward others beyond the six months prior to admission? : No Thoughts of Harm to Others: No Current Homicidal Intent: No Current Homicidal Plan: No Access to Homicidal Means: No Identified Victim: Denies History of harm to others?: No Assessment of Violence: None Noted Violent Behavior Description: Denies Does patient have access to weapons?: No Criminal Charges Pending?: Yes Describe Pending Criminal Charges: Dub Amis Does patient have a court date: Yes Court Date: 03/09/16 Is patient on probation?: No  Psychosis Hallucinations: None noted Delusions: None noted  Mental Status Report Appearance/Hygiene: In scrubs Eye Contact: Good Motor Activity: Unable to assess Speech: Logical/coherent Level of Consciousness: Alert Mood: Depressed Affect: Depressed Anxiety Level: None Thought Processes: Coherent, Relevant Judgement: Unimpaired Orientation: Person, Place, Time, Situation, Appropriate for developmental age Obsessive Compulsive Thoughts/Behaviors: None  Cognitive Functioning Concentration: Decreased Memory: Recent Intact, Remote Intact IQ: Average Insight: Fair Impulse Control: Fair Appetite: Poor Weight Loss: 8 (in past week) Sleep: Decreased Total Hours of Sleep: 4 Vegetative Symptoms: None  ADLScreening [facility] Assessment Services) Patient's cognitive ability adequate to safely complete daily activities?: Yes Patient able to express need for assistance with ADLs?: Yes Independently performs ADLs?: Yes (appropriate for developmental  age)  Prior Inpatient Therapy Prior Inpatient Therapy: No Prior Therapy Dates: N/A Prior Therapy Facilty/Provider(s): N/A Reason for Treatment: N/A  Prior  Outpatient Therapy Prior Outpatient Therapy: Yes Prior Therapy Dates: age 2 Prior Therapy Facilty/Provider(s): [facility] Reason for Treatment: Depression Does patient have an ACCT team?: No Does patient have Intensive In-House Services?  : No Does patient have [facility] services? : No Does patient have [facility] services?: No  ADL Screening (condition at time of admission) Patient's cognitive ability adequate to safely complete daily activities?: Yes Is the patient deaf or have difficulty hearing?: No Does the patient have difficulty seeing, even when wearing glasses/contacts?: No Does the patient have difficulty concentrating, remembering, or making decisions?: No Patient able to express need for assistance with ADLs?: Yes Does the patient have difficulty dressing or bathing?: No Independently performs ADLs?: Yes (appropriate for developmental age) Does the patient have difficulty walking or climbing stairs?: No Weakness of Legs: None Weakness of Arms/Hands: None  Home Assistive Devices/Equipment Home Assistive Devices/Equipment: None  Therapy Consults (therapy consults require a physician order) PT Evaluation Needed: No OT Evalulation Needed: No SLP Evaluation Needed: No Abuse/Neglect Assessment (Assessment to be complete while patient is alone) Physical Abuse: Denies Verbal Abuse: Yes, past (Comment) (in childhood) Sexual Abuse: Denies Exploitation of patient/patient's resources: Denies Self-Neglect: Denies Values / Beliefs Cultural Requests During Hospitalization: None Spiritual Requests During Hospitalization: None   Advance Directives (For Healthcare) Does patient have an advance directive?: No Would patient like information on creating an advanced directive?: No - patient declined information    Additional Information 1:1 In Past 12 Months?: No CIRT Risk: No Elopement Risk: No Does patient have medical clearance?: Yes     Disposition:  Disposition Initial Assessment  Completed for this Encounter: Yes Disposition of Patient: Inpatient treatment program (per Donell Sievert, PA-C) Type of inpatient treatment program: Adult  On Site Evaluation by:   Reviewed with Physician:    Pernie Grosso 02/20/2016 10:03 PM

## 2016-02-20 NOTE — ED Provider Notes (Signed)
[facility] Provider Note   CSN: [id number] Arrival date & time: 02/20/16  1719  First Provider Contact:  First MD Initiated Contact with Patient 02/20/16 1745        History   Chief Complaint Chief Complaint  Patient presents with  . Suicidal  . Medical Clearance  . Extremity Laceration    HPI Ellinor Test is a 25 y.o. female.  The history is provided by the patient, a friend and medical records.   25 year old female with history of anxiety, depression, IBS, presenting to the ED for suicidal ideation. Patient reports she has had suicidal thoughts for several years now. States recently she has had several drastic changes in her life. Specifically, patient's boyfriend broke up with her and is planning to move to [place] with her best friend. States she is anxious about this, more so because her best friend has been her confidant for a while now.  States she also recently had a miscarriage. She states this has been managed by her OB/GYN. She states she does not currently having any vaginal bleeding or abdominal cramping. States she is currently off of her birth control pills because of this. Patient states she does feel a tremendous amount of stress at several of these events have occurred in the past few weeks. Patient states she has been feeling "numb" so she began cutting her wrist in order "just to feel something". She does have some superficial lacerations to her left volar wrist which appear old and a new superficial laceration to her right volar wrist.  States she does continue to feel suicidal currently. She denies any homicidal ideation. No auditory or visual hallucinations. States she has been sleeping 4 or less hours nightly for the past week.  Has only been eating once a day because she knows she needs to-- does not feel hungry at all.  Admits to some occasional marijuana use, denies heavy alcohol use.  Patient was previously taking Lexapro for her depression, but she  stopped taking this 3 weeks ago as she no longer felt it was helping her. This was prescribed by her primary care doctor. She states she has been trying to see a therapist, however no one will accept her insurance. States she saw a therapist after her mother died in this did seem to help her symptoms.  Last tetanus was in highschool.  Past Medical History:  Diagnosis Date  . Anxiety   . Depression   . IBS (irritable bowel syndrome)     There are no active problems to display for this patient.   Past Surgical History:  Procedure Laterality Date  . WISDOM TOOTH EXTRACTION      OB History    No data available       Home Medications    Prior to Admission medications   Medication Sig Start Date End Date Taking? Authorizing Provider  albuterol (PROVENTIL HFA;VENTOLIN HFA) 108 (90 BASE) MCG/ACT inhaler Inhale 2 puffs into the lungs every 4 (four) hours as needed for wheezing. 04/22/13   Dorna Leitz, MD  amphetamine-dextroamphetamine (ADDERALL XR) 20 MG 24 hr capsule Take 20 mg by mouth daily as needed (focus).    Historical Provider, MD  escitalopram (LEXAPRO) 20 MG tablet Take 20 mg by mouth daily.    Historical Provider, MD  fexofenadine (ALLEGRA) 180 MG tablet Take 180 mg by mouth daily.    Historical Provider, MD  ibuprofen (ADVIL,MOTRIN) 800 MG tablet Take 1 tablet (800 mg total) by mouth 3 (three) times  daily. 11/20/13   Ivonne Andrew, PA-C  phentermine 37.5 MG capsule Take 37.5 mg by mouth every morning.    Historical Provider, MD  Pseudoeph-Doxylamine-DM-APAP 30-6.25-15-325 MG CAPS Take 2 capsules by mouth at bedtime as needed (for cold symptoms).    Historical Provider, MD    Family History No family history on file.  Social History Social History  Substance Use Topics  . Smoking status: Current Every Day Smoker  . Smokeless tobacco: Never Used  . Alcohol use No     Allergies   Other; Pork-derived products; and Penicillins   Review of Systems Review of Systems    Skin: Positive for wound.  Psychiatric/Behavioral: Positive for suicidal ideas.  All other systems reviewed and are negative.    Physical Exam Updated Vital Signs Pulse 71   Temp 97.9 F (36.6 C) (Oral)   Ht  (1.6 m)   Wt 59.9 kg   LMP 02/15/2016   SpO2 98%   BMI 23.38 kg/m   Physical Exam  Constitutional: She is oriented to person, place, and time. She appears well-developed and well-nourished.  HENT:  Head: Normocephalic and atraumatic.  Mouth/Throat: Oropharynx is clear and moist.  Eyes: Conjunctivae and EOM are normal. Pupils are equal, round, and reactive to light.  Neck: Normal range of motion.  Cardiovascular: Normal rate, regular rhythm and normal heart sounds.   Pulmonary/Chest: Effort normal and breath sounds normal.  Abdominal: Soft. Bowel sounds are normal.  Musculoskeletal: Normal range of motion.  1cm superficial laceration to the right volar wrist, no deep tissue, tendon, or vessel involvement; strong radial pulse and cap refill, normal ROM of and sensation of all fingers Superficial abrasions to left volar wrist No signs of infection surrounding any of the wounds, no drainage  Neurological: She is alert and oriented to person, place, and time.  Skin: Skin is warm and dry.  Psychiatric: She has a normal mood and affect. Her speech is normal and behavior is normal. She is not withdrawn and not actively hallucinating. She expresses suicidal ideation. She expresses no homicidal ideation. She expresses suicidal plans. She expresses no homicidal plans.  Nursing note and vitals reviewed.    ED Treatments / Results  Labs (all labs ordered are listed, but only abnormal results are displayed) Labs Reviewed  ACETAMINOPHEN LEVEL - Abnormal; Notable for the following:       Result Value   Acetaminophen (Tylenol), Serum <10 (*)    All other components within normal limits  CBC - Abnormal; Notable for the following:    Hemoglobin 15.2 (*)    All other  components within normal limits  URINE RAPID DRUG SCREEN, HOSP PERFORMED - Abnormal; Notable for the following:    Tetrahydrocannabinol POSITIVE (*)    All other components within normal limits  COMPREHENSIVE METABOLIC PANEL  ETHANOL  SALICYLATE LEVEL  POC URINE PREG, ED    EKG  EKG Interpretation None       Radiology No results found.  Procedures Procedures (including critical care time)  LACERATION REPAIR Performed by: Garlon Hatchet Authorized by: Garlon Hatchet Consent: Verbal consent obtained. Risks and benefits: risks, benefits and alternatives were discussed Consent given by: patient Patient identity confirmed: provided demographic data Prepped and Draped in normal sterile fashion Wound explored  Laceration Location: right volar forearm  Laceration Length: 1 cm, superficial  No Foreign Bodies seen or palpated  Anesthesia: none  Local anesthetic: none  Anesthetic total: 0 ml  Irrigation method: syringe Amount of cleaning:  standard  Skin closure: steri- strips  Number of sutures: 0  Technique: n/a  Patient tolerance: Patient tolerated the procedure well with no immediate complications.   Medications Ordered in ED Medications - No data to display   Initial Impression / Assessment and Plan / ED Course  I have reviewed the triage vital signs and the nursing notes.  Pertinent labs & imaging results that were available during my care of the patient were reviewed by me and considered in my medical decision making (see chart for details).  Clinical Course   25 y.o. F here with suicidal thoughts and right wrist laceration.  Denies HI/AVH.  Laceration of right volar wrist superficial.  Skin well approximated with steri-strips.  Abrasions of left volar wrist are old.  No signs of infection.  Tetanus updated.  Screening labs reassuring.  Patient medically cleared and awaiting TTS evaluation.  Holding orders and home meds in place.  Final Clinical  Impressions(s) / ED Diagnoses   Final diagnoses:  Suicidal ideation  Wrist laceration, right, initial encounter    New Prescriptions New Prescriptions   No medications on file     Oletha Blend 02/20/16 2038    Laurence Spates, MD 02/20/16 [phone]

## 2016-02-20 NOTE — ED Notes (Addendum)
Pt from home with thoughts of SI. Pt has a laceration that is about 4 inches and deep on her right wrist. Bleeding is controled. Pt states she did this with a razor blade. Pt is unsure when her last tetanus shot was. Pt has superficial cuts on her left wrist. Pt states that this month her best friend decided to move to [place], her boyfriend broke up with her, she had a miscarriage, and she got a ticket for trespassing at her apartment pool. Pt states she stopped taking her depression and anxiety medication about 3 weeks ago because she states they didn't seem to work anymore. Pt is calm and cooperative at time of assessment, and is tearful. Pt endorses SI at this time, but contracts for safety. Pt denies HI or AVH   Pt states she has a family hx of suicide. Pt's mother committed suicide when pt was 34 years old. Pt states she was the one to find her mother. Pt states following this she was diagnosed with anxiety and depression

## 2016-02-21 ENCOUNTER — Inpatient Hospital Stay (HOSPITAL_COMMUNITY)
Admission: AD | Admit: 2016-02-21 | Discharge: 2016-02-24 | DRG: 885 | Disposition: A | Discharge status: Home / Self Care | Payer: Managed Care, Other (non HMO) | Source: Intra-hospital | Attending: Psychiatry | Admitting: Psychiatry

## 2016-02-21 ENCOUNTER — Encounter (HOSPITAL_COMMUNITY): Payer: Self-pay

## 2016-02-21 DIAGNOSIS — F1220 Cannabis dependence, uncomplicated: Secondary | ICD-10-CM | POA: Diagnosis present

## 2016-02-21 DIAGNOSIS — G4700 Insomnia, unspecified: Secondary | ICD-10-CM | POA: Diagnosis present

## 2016-02-21 DIAGNOSIS — F17210 Nicotine dependence, cigarettes, uncomplicated: Secondary | ICD-10-CM | POA: Diagnosis present

## 2016-02-21 DIAGNOSIS — R45851 Suicidal ideations: Secondary | ICD-10-CM | POA: Diagnosis present

## 2016-02-21 DIAGNOSIS — Z818 Family history of other mental and behavioral disorders: Secondary | ICD-10-CM

## 2016-02-21 DIAGNOSIS — F322 Major depressive disorder, single episode, severe without psychotic features: Secondary | ICD-10-CM | POA: Diagnosis present

## 2016-02-21 MED ORDER — NICOTINE 21 MG/24HR TD PT24
21.0000 mg | MEDICATED_PATCH | Freq: Every day | TRANSDERMAL | Status: DC
  Administered 2016-02-22 – 2016-02-24 (×3): 21 mg via TRANSDERMAL
  Filled 2016-02-21 (×5): qty 1

## 2016-02-21 MED ORDER — ENSURE ENLIVE PO LIQD: 237.0000 mL | Freq: Two times a day (BID) | ORAL | Status: DC

## 2016-02-21 MED ORDER — ALBUTEROL SULFATE HFA 108 (90 BASE) MCG/ACT IN AERS
2.0000 | INHALATION_SPRAY | RESPIRATORY_TRACT | Status: DC | PRN
Start: 2016-02-21 — End: 2016-02-24

## 2016-02-21 MED ORDER — ACETAMINOPHEN 325 MG PO TABS
650.0000 mg | ORAL_TABLET | Freq: Four times a day (QID) | ORAL | Status: DC | PRN
  Administered 2016-02-21 – 2016-02-23 (×2): 650 mg via ORAL
  Filled 2016-02-21 (×2): qty 2

## 2016-02-21 MED ORDER — TRAZODONE HCL 50 MG PO TABS
50.0000 mg | ORAL_TABLET | Freq: Every evening | ORAL | Status: DC | PRN
  Administered 2016-02-21 – 2016-02-22 (×2): 50 mg via ORAL
  Filled 2016-02-21 (×10): qty 1

## 2016-02-21 MED ORDER — MAGNESIUM HYDROXIDE 400 MG/5ML PO SUSP: 30.0000 mL | Freq: Every day | ORAL | Status: DC | PRN

## 2016-02-21 MED ORDER — HYDROXYZINE HCL 25 MG PO TABS
25.0000 mg | ORAL_TABLET | Freq: Four times a day (QID) | ORAL | Status: DC | PRN
  Administered 2016-02-22 – 2016-02-23 (×2): 25 mg via ORAL
  Filled 2016-02-21 (×2): qty 1

## 2016-02-21 MED ORDER — ALUM & MAG HYDROXIDE-SIMETH 200-200-20 MG/5ML PO SUSP: 30.0000 mL | ORAL | Status: DC | PRN

## 2016-02-21 MED ORDER — ESCITALOPRAM OXALATE 20 MG PO TABS
20.0000 mg | ORAL_TABLET | Freq: Every day | ORAL | Status: DC
  Administered 2016-02-21 – 2016-02-24 (×5): 20 mg via ORAL
  Filled 2016-02-21 (×6): qty 1

## 2016-02-21 NOTE — Tx Team (Signed)
Initial Interdisciplinary Treatment Plan   PATIENT STRESSORS: Educational concerns bu with bf miscarriage   PATIENT STRENGTHS: Active sense of humor Average or above average intelligence Capable of independent living Communication skills Supportive family/friends Work skills   PROBLEM LIST: Problem List/Patient Goals Date to be addressed Date deferred Reason deferred Estimated date of resolution  Depression with SI      Anxiety      "I would like to learn to cope with being alone and to be able to handle stressful situations"                                           DISCHARGE CRITERIA:  Improved stabilization in mood, thinking, and/or behavior Motivation to continue treatment in a less acute level of care Need for constant or close observation no longer present  PRELIMINARY DISCHARGE PLAN: Outpatient therapy Return to previous living arrangement Return to previous work or school arrangements  PATIENT/FAMIILY INVOLVEMENT: This treatment plan has been presented to and reviewed with the patient, Brianna Wilkinson, and/or family member, .  The patient and family have been given the opportunity to ask questions and make suggestions.  [name] 02/21/2016, 3:00 AM
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
## 2016-02-21 NOTE — Progress Notes (Signed)
Brianna Wilkinson is seen sitting quietly in the dayroom. She avoids eye contact.She is shy-like, on approach and she minimizes her sadness and feelings and reason for admission, saying " its not that bad, really". A She completes her daily assessment and writes on it that she denies having suicidal ideation today and she rates her depression, hopelessness and anxiety " 3/0/0", repsectively. R She is encouraged to be honest with her feelings and to allow herself to engage in learning healthier coping skills.

## 2016-02-21 NOTE — [facility] Group Notes (Signed)
[facility] Group Notes:  (Clinical Social Work)  02/21/2016     1:15-2:15PM  Summary of Progress/Problems:   The main focus of today's process group was to explore the perceived benefits and costs of unhealthy coping techniques, as well as the  benefits and costs of replacing these with healthy coping skills.  We then discussed why it is so hard to pursue healthier coping techniques, talked about any barriers and how to overcome them. The patient expressed that her own unhealthy coping involves cutting herself, while her healthy coping includes music and meditation.  She stated she wants to start incorporating more healthy coping by taking her dog to the park and exercising.  She was fully engaged throughout group, assisted with a boundary exercise that was done when she asked how she could go about not just opening herself up to everyone and being hurt.  Type of Therapy:  Group Therapy - Process   Participation Level:  Active  Participation Quality:  Attentive and Sharing  Affect:  Appropriate  Cognitive:  Appropriate  Insight:  Engaged  Engagement in Therapy:  Engaged  Modes of Intervention:  Education, Motivational Interviewing  Ambrose Mantle, LCSW 02/21/2016, 4:08 PM

## 2016-02-21 NOTE — H&P (Signed)
Psychiatric Admission Assessment Adult  Patient Identification: Brianna Wilkinson MRN:  [id number] Date of Evaluation:  02/21/2016 Chief Complaint:  MDD SINGLE EPISODE ;SEVERE Principal Diagnosis: <principal problem not specified> Diagnosis:   Patient Active Problem List   Diagnosis Date Noted  . MDD (major depressive disorder), severe ([place]) [F32.2] 02/21/2016   History of Present Illness: Brianna Wilkinson is an 25 y.o. female presenting voluntarily to [facility] for suicidal ideations and lacerations to her right wrist. Patient was depressed and overwhelmed with significant stresors in he life including Break up with boy friend. Recent miscarriage.  She states that her best friend is moving to [place] with her previous ex-boyfriend and states "they are like my second family." Patient states that her family lives far away and states that the closest family member is three and a half hours away. Patient states that her mother committed suicide when she was 6 years of age. She states that when her best friends moves away she will not have any support. Patient states "I guess it finally hit me with everything that happened this week and I didn't want to do it anymore." Patient states "I've been feeling alone because I lost my boyfriend and some friends and my family is three and a half to nine hours away." Patient endorses symptoms of depression as.; insomnia, tearfulness, fatigue, anhedonia,and loss of appetite which resulted in losing 8 pounds in the past week.   Patient is THC positive Endorses worries and on evaluation today continues to endorse depression but not suicidal. No psychotic symptoms  Associated Signs/Symptoms: Depression Symptoms:  anhedonia, insomnia, fatigue, difficulty concentrating, loss of energy/fatigue, (Hypo) Manic Symptoms:  Distractibility, Anxiety Symptoms:  Excessive Worry, Psychotic Symptoms:  denies  Total Time spent with patient: 1 hour  Past Psychiatric  History: Depression.   Is the patient at risk to self? Yes.    Has the patient been a risk to self in the past 6 months? Yes.    Has the patient been a risk to self within the distant past? No.  Is the patient a risk to others? No.  Has the patient been a risk to others in the past 6 months? No.  Has the patient been a risk to others within the distant past? No.   Prior Inpatient Therapy:   Prior Outpatient Therapy:    Alcohol Screening: 1. How often do you have a drink containing alcohol?: Never 9. Have you or someone else been injured as a result of your drinking?: No 10. Has a relative or friend or a doctor or another health worker been concerned about your drinking or suggested you cut down?: No Alcohol Use Disorder Identification Test Final Score (AUDIT): 0 Substance Abuse History in the last 12 months:  No. Consequences of Substance Abuse: uses infrequent marijuana. not clear of her regular use but understands it can efffect depresion Previous Psychotropic Medications: Yes  Celexa worked for a while in past Psychological Evaluations: No  Past Medical History:  Past Medical History:  Diagnosis Date  . Anxiety   . Depression   . IBS (irritable bowel syndrome)     Past Surgical History:  Procedure Laterality Date  . WISDOM TOOTH EXTRACTION     Family History: History reviewed. No pertinent family history. Family Psychiatric  History: Mother: committed suicide when patient was young by overdose. Tobacco Screening: Have you used any form of tobacco in the last 30 days? (Cigarettes, Smokeless Tobacco, Cigars, and/or Pipes): Yes Tobacco use, Select all that apply: 4 or  less cigarettes per day Are you interested in Tobacco Cessation Medications?: Yes, will notify MD for an order Counseled patient on smoking cessation including recognizing danger situations, developing coping skills and basic information about quitting provided: Yes Social History:  History  Alcohol Use No      History  Drug Use No    Additional Social History: Marital status: Single Are you sexually active?: No What is your sexual orientation?: heterosexual Does patient have children?: No    Pain Medications: Denies Prescriptions: Denies Over the Counter: Denies Name of Substance 1: THC 1 - Age of First Use: 17 1 - Amount (size/oz): a bowl 1 - Frequency: sometimes twice a week, then goes a month without smoking depends on stress 1 - Duration: ongoing 1 - Last Use / Amount: 3 days ago                  Allergies:   Allergies  Allergen Reactions  . Other Diarrhea    [employer], Spicy foods, Pork , grape food coloring  . Pork-Derived Products Diarrhea    IBS   . Penicillins Other (See Comments)    unknown   Lab Results:  Results for orders placed or performed during the hospital encounter of 02/20/16 (from the past 48 hour(s))  Rapid urine drug screen (hospital performed)     Status: Abnormal   Collection Time: 02/20/16  5:37 PM  Result Value Ref Range   Opiates NONE DETECTED NONE DETECTED   Cocaine NONE DETECTED NONE DETECTED   Benzodiazepines NONE DETECTED NONE DETECTED   Amphetamines NONE DETECTED NONE DETECTED   Tetrahydrocannabinol POSITIVE (A) NONE DETECTED   Barbiturates NONE DETECTED NONE DETECTED    Comment:        DRUG SCREEN FOR MEDICAL PURPOSES ONLY.  IF CONFIRMATION IS NEEDED FOR ANY PURPOSE, NOTIFY LAB WITHIN 5 DAYS.        LOWEST DETECTABLE LIMITS FOR URINE DRUG SCREEN Drug Class       Cutoff (ng/mL) Amphetamine      1000 Barbiturate      200 Benzodiazepine   924 Tricyclics       268 Opiates          300 Cocaine          300 THC              50   Comprehensive metabolic panel     Status: None   Collection Time: 02/20/16  5:51 PM  Result Value Ref Range   Sodium 139 135 - 145 mmol/L   Potassium 3.5 3.5 - 5.1 mmol/L   Chloride 109 101 - 111 mmol/L   CO2 25 22 - 32 mmol/L   Glucose, Bld 94 65 - 99 mg/dL   BUN 6 6 - 20 mg/dL    Creatinine, Ser 0.53 0.44 - 1.00 mg/dL   Calcium 9.0 8.9 - 10.3 mg/dL   Total Protein 7.5 6.5 - 8.1 g/dL   Albumin 4.8 3.5 - 5.0 g/dL   AST 16 15 - 41 U/L   ALT 15 14 - 54 U/L   Alkaline Phosphatase 56 38 - 126 U/L   Total Bilirubin 0.9 0.3 - 1.2 mg/dL   GFR calc non Af Amer >60 >60 mL/min   GFR calc Af Amer >60 >60 mL/min    Comment: (NOTE) The eGFR has been calculated using the CKD EPI equation. This calculation has not been validated in all clinical situations. eGFR's persistently <60 mL/min signify possible Chronic  Kidney Disease.    Anion gap 5 5 - 15  Ethanol     Status: None   Collection Time: 02/20/16  5:51 PM  Result Value Ref Range   Alcohol, Ethyl (B) <5 <5 mg/dL    Comment:        LOWEST DETECTABLE LIMIT FOR SERUM ALCOHOL IS 5 mg/dL FOR MEDICAL PURPOSES ONLY   Salicylate level     Status: None   Collection Time: 02/20/16  5:51 PM  Result Value Ref Range   Salicylate Lvl <6.5 2.8 - 30.0 mg/dL  Acetaminophen level     Status: Abnormal   Collection Time: 02/20/16  5:51 PM  Result Value Ref Range   Acetaminophen (Tylenol), Serum <10 (L) 10 - 30 ug/mL    Comment:        THERAPEUTIC CONCENTRATIONS VARY SIGNIFICANTLY. A RANGE OF 10-30 ug/mL MAY BE AN EFFECTIVE CONCENTRATION FOR MANY PATIENTS. HOWEVER, SOME ARE BEST TREATED AT CONCENTRATIONS OUTSIDE THIS RANGE. ACETAMINOPHEN CONCENTRATIONS >150 ug/mL AT 4 HOURS AFTER INGESTION AND >50 ug/mL AT 12 HOURS AFTER INGESTION ARE OFTEN ASSOCIATED WITH TOXIC REACTIONS.   cbc     Status: Abnormal   Collection Time: 02/20/16  5:51 PM  Result Value Ref Range   WBC 6.5 4.0 - 10.5 K/uL   RBC 4.82 3.87 - 5.11 MIL/uL   Hemoglobin 15.2 (H) 12.0 - 15.0 g/dL   HCT 44.8 36.0 - 46.0 %   MCV 92.9 78.0 - 100.0 fL   MCH 31.5 26.0 - 34.0 pg   MCHC 33.9 30.0 - 36.0 g/dL   RDW 12.8 11.5 - 15.5 %   Platelets 235 150 - 400 K/uL  POC urine preg, ED     Status: None   Collection Time: 02/20/16  8:02 PM  Result Value Ref Range    Preg Test, Ur NEGATIVE NEGATIVE    Comment:        THE SENSITIVITY OF THIS METHODOLOGY IS >24 mIU/mL     Blood Alcohol level:  Lab Results  Component Value Date   ETH <5 53/74/8270    Metabolic Disorder Labs:  No results found for: HGBA1C, MPG No results found for: PROLACTIN No results found for: CHOL, TRIG, HDL, CHOLHDL, VLDL, LDLCALC  Current Medications: Current Facility-Administered Medications  Medication Dose Route Frequency Provider Last Rate Last Dose  . acetaminophen (TYLENOL) tablet 650 mg  650 mg Oral Q6H PRN Laverle Hobby, PA-C   650 mg at 02/21/16 0843  . albuterol (PROVENTIL HFA;VENTOLIN HFA) 108 (90 Base) MCG/ACT inhaler 2 puff  2 puff Inhalation Q4H PRN Laverle Hobby, PA-C      . alum & mag hydroxide-simeth (MAALOX/MYLANTA) 200-200-20 MG/5ML suspension 30 mL  30 mL Oral Q4H PRN Laverle Hobby, PA-C      . escitalopram (LEXAPRO) tablet 20 mg  20 mg Oral Daily Laverle Hobby, PA-C   20 mg at 02/21/16 7867  . hydrOXYzine (ATARAX/VISTARIL) tablet 25 mg  25 mg Oral Q6H PRN Laverle Hobby, PA-C      . magnesium hydroxide (MILK OF MAGNESIA) suspension 30 mL  30 mL Oral Daily PRN Laverle Hobby, PA-C      . traZODone (DESYREL) tablet 50 mg  50 mg Oral QHS,MR X 1 Spencer E Simon, PA-C       PTA Medications: Prescriptions Prior to Admission  Medication Sig Dispense Refill Last Dose  . amphetamine-dextroamphetamine (ADDERALL) 20 MG tablet Take 20 mg by mouth daily as needed (FOCUS).   Past  Month at Unknown time  . dicyclomine (BENTYL) 20 MG tablet Take 20 mg by mouth every 6 (six) hours as needed for spasms.   Past Week at Unknown time  . escitalopram (LEXAPRO) 20 MG tablet Take 20 mg by mouth daily.   Past Month at Unknown time  . albuterol (PROVENTIL HFA;VENTOLIN HFA) 108 (90 BASE) MCG/ACT inhaler Inhale 2 puffs into the lungs every 4 (four) hours as needed for wheezing. 1 Inhaler 0 Unknown at Unknown time  . calcium-vitamin D (OSCAL WITH D) 250-125 MG-UNIT tablet  Take 1 tablet by mouth daily.   2 WEEKS    Musculoskeletal: Strength & Muscle Tone: within normal limits Gait & Station: normal Patient leans: no lean  Psychiatric Specialty Exam: Physical Exam  Constitutional: She appears well-developed and well-nourished.    Review of Systems  Cardiovascular: Negative for chest pain.  Skin: Negative for rash.  Psychiatric/Behavioral: Positive for depression. Negative for suicidal ideas. The patient is nervous/anxious.     Blood pressure 110/86, pulse 61, temperature 97.9 F (36.6 C), temperature source Oral, resp. rate 17, height 5' 2.5" (1.588 m), weight 60.3 kg (133 lb), last menstrual period 02/15/2016.Body mass index is 23.94 kg/m.  General Appearance: Casual  Eye Contact:  Fair  Speech:  Normal Rate  Volume:  Decreased  Mood:  Depressed and Dysphoric  Affect:  Constricted  Thought Process:  Goal Directed  Orientation:  Full (Time, Place, and Person)  Thought Content:  Rumination  Suicidal Thoughts:  No  Homicidal Thoughts:  No  Memory:  Immediate;   Fair Recent;   Fair  Judgement:  Poor  Insight:  Shallow  Psychomotor Activity:  Decreased  Concentration:  Concentration: Fair and Attention Span: Fair  Recall:  [employer] of Knowledge:  Fair  Language:  Fair  Akathisia:  No  Handed:  Right  AIMS (if indicated):     Assets:  Desire for Improvement  ADL's:  Intact  Cognition:  WNL  Sleep:  Number of Hours: 3       Treatment Plan Summary: Daily contact with patient to assess and evaluate symptoms and progress in treatment, Medication management and Plan as follows  Lexapro has been started for depression Will monitor labs  Patient to attend groups and supportive therapy Encourage abstinence from drugs use Involve family members in support    Observation Level/Precautions:  15 minute checks  Laboratory:  as needed  Psychotherapy:  As per group protocols  Medications:  See chart  Consultations:  As needed  Discharge  Concerns:  Depression follow up  Estimated LOS: 4-5 days  Other:     I certify that inpatient services furnished can reasonably be expected to improve the patient's condition.    Merian Capron, MD 8/5/201711:59 AM

## 2016-02-21 NOTE — Progress Notes (Signed)
[facility] Group Notes:  (Nursing/MHT/Case Management/Adjunct)  Date:  02/21/2016  Time:  2045  Type of Therapy:  wrap up group  Participation Level:  Active  Participation Quality:  Appropriate, Attentive, Sharing and Supportive  Affect:  Anxious  Cognitive:  Appropriate  Insight:  Improving  Engagement in Group:  Engaged  Modes of Intervention:  Clarification, Education and Support  Summary of Progress/Problems: Pt shared that she has been struggling because of events and circumstances that have been beyond her control since she was 25 years old. Pt reports starting to see clear now and is looking to the future. Pt shared that she wants to finish school and she is grateful for her family.   Brianna Wilkinson 02/21/2016, 8:59 PM

## 2016-02-21 NOTE — Progress Notes (Addendum)
Patient ID: Brianna Wilkinson, female   DOB: December 20, 1990, 25 y.o.   MRN: [id number]   Pt currently presents with a blunted affect and anxious behavior. Pt reports to writer that their goal is to "go to groups and learn things." Pt states "I realize now that cutting my wrists was a cry for help." Pt states that she has had a good conversation with her father and they have set up a plan for if she begins to feel suicidal. Pt attributes her recent depression to a lack of support, unsteady interpersonal relationships and the anniversary of her mothers death. Pt reports good sleep with current medication regimen.   Pt provided with medications per providers orders. Pt's labs and vitals were monitored throughout the night. Pt supported emotionally and encouraged to express concerns and questions. Pt educated on medications. Pt requests nicotine patch, see MAR. Pt provided with information about suicide prevention resources.   Pt's safety ensured with 15 minute and environmental checks. Pt currently denies SI/HI and A/V hallucinations. Pt verbally agrees to seek staff if SI/HI or A/VH occurs and to consult with staff before acting on any harmful thoughts. Pt wishes to be set up with a counselor post discharge, pt reports she has had a hard time being accepted to counseling centers with her current insurance. Will continue POC.

## 2016-02-21 NOTE — [facility] Group Notes (Signed)
[facility] Group Notes:  (Nursing/MHT/Case Management/Adjunct)  Date:  02/21/2016  Time:  1499  Type of Therapy:  Life SKills  /  Identifying Needs:   The group focuses on teaching patients how to identify their needs as well as develop healthy skills needed to get their needs met.   Participation Level:  Active  Participation Quality:  Attentive  Affect:  Appropriate  Cognitive:  Alert  Insight:  Improving  Engagement in Group:  Engaged  Modes of Intervention:  Discussion  Summary of Progress/Problems:  Lauralyn Primes 02/21/2016, 4:34 PM

## 2016-02-21 NOTE — [facility] Suicide Risk Assessment (Signed)
[facility] Admission Suicide Risk Assessment   Nursing information obtained from:  Patient Demographic factors:  Adolescent or young adult, Caucasian, Brianna Wilkinson, lesbian, or bisexual orientation, Living alone Current Mental Status:  Self-harm behaviors Loss Factors:  Loss of significant relationship Historical Factors:  Prior suicide attempts, Family history of suicide Risk Reduction Factors:  Sense of responsibility to family, Employed, Positive social support  Total Time spent with patient: 1.5 hours Principal Problem: <principal problem not specified> Diagnosis:   Patient Active Problem List   Diagnosis Date Noted  . MDD (major depressive disorder), severe (HCC) [F32.2] 02/21/2016   Subjective Data: Alert oriented, depressed and has prsented with cutting wrist. Slept reasonable. Feeling low but not hopeless today.  Continued Clinical Symptoms:  Alcohol Use Disorder Identification Test Final Score (AUDIT): 0 The "Alcohol Use Disorders Identification Test", Guidelines for Use in Primary Care, Second Edition.  World Science writer [facility]). Score between 0-7:  no or low risk or alcohol related problems. Score between 8-15:  moderate risk of alcohol related problems. Score between 16-19:  high risk of alcohol related problems. Score 20 or above:  warrants further diagnostic evaluation for alcohol dependence and treatment.   CLINICAL FACTORS:   Depression:   Impulsivity Alcohol/Substance Abuse/Dependencies More than one psychiatric diagnosis Unstable or Poor Therapeutic Relationship Previous Psychiatric Diagnoses and Treatments   Musculoskeletal: Strength & Muscle Tone: within normal limits Gait & Station: normal Patient leans: no lean  Psychiatric Specialty Exam: Physical Exam  Constitutional: She appears well-developed and well-nourished.    Review of Systems  Cardiovascular: Negative for chest pain.  Skin: Negative for rash.  Psychiatric/Behavioral: Positive for depression and  substance abuse. The patient is nervous/anxious.     Blood pressure 110/86, pulse 61, temperature 97.9 F (36.6 C), temperature source Oral, resp. rate 17, height 5' 2.5" (1.588 m), weight 60.3 kg (133 lb), last menstrual period 02/15/2016.Body mass index is 23.94 kg/m.  General Appearance: Casual  Eye Contact:  Fair  Speech:  Normal Rate  Volume:  Decreased  Mood:  Dysphoric  Affect:  Constricted  Thought Process:  Goal Directed  Orientation:  Full (Time, Place, and Person)  Thought Content:  Rumination  Suicidal Thoughts:  Denies now  Homicidal Thoughts:  No  Memory:  Immediate;   Fair Recent;   Fair  Judgement:  Poor  Insight:  Shallow  Psychomotor Activity:  Normal  Concentration:  Concentration: Fair and Attention Span: Fair  Recall:  [employer] of Knowledge:  Fair  Language:  Fair  Akathisia:  No  Handed:  Right  AIMS (if indicated):     Assets:  Desire for Improvement Talents/Skills  ADL's:  Intact  Cognition:  WNL  Sleep:  Number of Hours: 3      COGNITIVE FEATURES THAT CONTRIBUTE TO RISK:  Closed-mindedness    SUICIDE RISK:   Moderate:  Frequent suicidal ideation with limited intensity, and duration, some specificity in terms of plans, no associated intent, good self-control, limited dysphoria/symptomatology, some risk factors present, and identifiable protective factors, including available and accessible social support.   PLAN OF CARE: Admit for stabilization and medication management.   I certify that inpatient services furnished can reasonably be expected to improve the patient's condition.  Thresa Ross, MD 02/21/2016, 11:56 AM

## 2016-02-21 NOTE — Progress Notes (Signed)
Pt. Presents with increased depression and anxiety. Pt. Cut her R wrist but states she stopped herself and called a friend to get her help. Pt. Had a recent breakup with a boyfriend, and a miscarriage.  Pt. Then learned that her 2 best friends are moving to [place].  Pt. Now denies SI, but admits she needs to learn coping skills to deal with stress and being alone.  Pt. Has history of IBS, as well as ADD and ADHD.   Her home meds include Lexapro, Bentyl and Adderall.  Pt. Is calm and cooperative.  Pt. Was introduced to the adult unit and escorted to the 400 hall, she was offered food and fluids.

## 2016-02-22 MED ORDER — ENSURE ENLIVE PO LIQD: 237.0000 mL | Freq: Two times a day (BID) | ORAL | Status: DC

## 2016-02-22 NOTE — Progress Notes (Signed)
D: Pt denies SI/HI/AVH. Pt is pleasant and cooperative. Pt stated she was feeling "better". Pt admits to making a mistake and feels good because she has good support and plans on going back to school.    A: Pt was offered support and encouragement. Pt was given scheduled medications. Pt was encourage to attend groups. Q 15 minute checks were done for safety.   R:Pt attends groups and interacts well with peers and staff. Pt is taking medication. Pt has no complaints at this time .Pt receptive to treatment and safety maintained on unit.

## 2016-02-22 NOTE — Plan of Care (Signed)
Problem: Coping: Goal: Ability to verbalize feelings will improve Outcome: Progressing Pt stated she was feeling "better" today, and stated she felt hopeful about the future.

## 2016-02-22 NOTE — [facility] Group Notes (Signed)
[facility] Group Notes:  (Nursing/MHT/Case Management/Adjunct)  Date:  02/22/2016  Time:  10:28 AM  Type of Therapy:  Psychoeducational Skills  Participation Level:  Did Not Attend  Participation Quality:  Did Not Attend  Affect:  Did Not Attend  Cognitive:  Did Not Attend  Insight:  None  Engagement in Group:  Did Not Attend  Modes of Intervention:  Did Not Attend  Summary of Progress/Problems: Pt did not attend patient self inventory group.   Jacquelyne BalintForrest, Rudine Rieger Shanta 02/22/2016, 10:28 AM

## 2016-02-22 NOTE — Progress Notes (Signed)
NUTRITION ASSESSMENT  Pt identified as at risk on the Malnutrition Screen Tool  INTERVENTION: 1. Supplements: Ensure Enlive po BID, each supplement provides 350 kcal and 20 grams of protein  NUTRITION DIAGNOSIS: Unintentional weight loss related to sub-optimal intake as evidenced by pt report.   Goal: Pt to meet >/= 90% of their estimated nutrition needs.  Monitor:  PO intake  Assessment:  Pt admitted with depression. Pt reports weight loss of 8 lb over the last week. This is a 6% wt loss x 1 week. Pt would benefit from nutritional supplements, RD to order.  Height: Ht Readings from Last 1 Encounters:  02/21/16 5' 2.5" (1.588 m)    Weight: Wt Readings from Last 1 Encounters:  02/21/16 133 lb (60.3 kg)    Weight Hx: Wt Readings from Last 10 Encounters:  02/21/16 133 lb (60.3 kg)  02/20/16 132 lb (59.9 kg)  04/20/14 166 lb (75.3 kg)  11/20/13 179 lb (81.2 kg)    BMI:  Body mass index is 23.94 kg/m. Pt meets criteria for normal range based on current BMI.  Estimated Nutritional Needs: Kcal: 25-30 kcal/kg Protein: > 1 gram protein/kg Fluid: 1 ml/kcal  Diet Order: Diet regular Room service appropriate? Yes; Fluid consistency: Thin Pt is also offered choice of unit snacks mid-morning and mid-afternoon.  Pt is eating as desired.   Lab results and medications reviewed.   Tilda FrancoLindsey Denzel Etienne, MS, RD, LDN Pager: [phone] After Hours Pager: [phone]

## 2016-02-22 NOTE — [facility] Group Notes (Signed)
[facility] Group Notes:  (Clinical Social Work)   @TODAY @   1:15-2:15PM  Summary of Progress/Problems:   The main focus of today's process group was to   1)  discuss the importance of adding healthy supports  2)  discuss ways to go about adding the first healthy support that can help us with the additional ones  3)  identify the patient's current unhealthy supports and  4)  Identify the patient's current healthy supports.  An emphasis was placed on putting in place a tangible reminder of patient's own goals in order to focus on remaining a healthy support for oneself.  A song was heard at the beginning and again at the end of group to focus the conversation.  Accountability was discussed at length.  Patient stated her family and friends are healthy supports while her ex-boyfriend and grandmother are unhealthy.  She asked group for advice on what to do about several situations where she has unhealthy supports, and received feedback that was helpful from the group.  Type of Therapy:  Process Group with Motivational Interviewing  Participation Level:  Active  Participation Quality:  Attentive and Sharing  Affect:  Appropriate  Cognitive:  Appropriate  Insight:  Engaged  Engagement in Therapy:  Engaged  Modes of Intervention:   Education, Teacher, English as a foreign languageupport and Processing, Activity  Pilgrim's PrideMareida Grossman-Orr, LCSW @TODAY @   4:10PM

## 2016-02-22 NOTE — Progress Notes (Signed)
[facility] MD Progress Note  02/22/2016 10:40 AM Brianna Wilkinson  MRN:  [id number] Subjective:  Feeling somewhat better but still down , says slept better. Had friends come over last evening and gave books to read.  HPI: Brianna Wilkinson an 25 y.o.femalepresenting voluntarily to [facility] for suicidal ideations and lacerations to her right wrist. Patient was depressed and overwhelmed with significant stresors in he life including Break up with boy friend. Recent miscarriage.  Still down but insight improving.  Tolerating medications  No psychotic symptoms THC positive but denies regular use .  Principal Problem: <principal problem not specified> Diagnosis:   Patient Active Problem List   Diagnosis Date Noted  . MDD (major depressive disorder), severe ([place]) [F32.2] 02/21/2016   Total Time spent with patient: 20 minutes  Past Psychiatric History: depression  Past Medical History:  Past Medical History:  Diagnosis Date  . Anxiety   . Depression   . IBS (irritable bowel syndrome)     Past Surgical History:  Procedure Laterality Date  . WISDOM TOOTH EXTRACTION     Family History: History reviewed. No pertinent family history. Family Psychiatric  History: Mom depression and committed suicide.  Social History:  History  Alcohol Use No     History  Drug Use No    Social History   Social History  . Marital status: Single    Spouse name: N/A  . Number of children: N/A  . Years of education: N/A   Social History Main Topics  . Smoking status: Current Every Day Smoker    Packs/day: 1.00    Years: 4.00    Types: Cigarettes  . Smokeless tobacco: Never Used  . Alcohol use No  . Drug use: No  . Sexual activity: Yes    Birth control/ protection: Pill   Other Topics Concern  . None   Social History Narrative  . None   Additional Social History:    Pain Medications: Denies Prescriptions: Denies Over the Counter: Denies Name of Substance 1: THC 1 - Age of First Use:  17 1 - Amount (size/oz): a bowl 1 - Frequency: sometimes twice a week, then goes a month without smoking depends on stress 1 - Duration: ongoing 1 - Last Use / Amount: 3 days ago                  Sleep: Fair  Appetite:  Fair  Current Medications: Current Facility-Administered Medications  Medication Dose Route Frequency Provider Last Rate Last Dose  . acetaminophen (TYLENOL) tablet 650 mg  650 mg Oral Q6H PRN Laverle Hobby, PA-C   650 mg at 02/21/16 0843  . albuterol (PROVENTIL HFA;VENTOLIN HFA) 108 (90 Base) MCG/ACT inhaler 2 puff  2 puff Inhalation Q4H PRN Laverle Hobby, PA-C      . alum & mag hydroxide-simeth (MAALOX/MYLANTA) 200-200-20 MG/5ML suspension 30 mL  30 mL Oral Q4H PRN Laverle Hobby, PA-C      . escitalopram (LEXAPRO) tablet 20 mg  20 mg Oral Daily Laverle Hobby, PA-C   20 mg at 02/22/16 0845  . feeding supplement (ENSURE ENLIVE) (ENSURE ENLIVE) liquid 237 mL  237 mL Oral BID BM Myer Peer Cobos, MD      . hydrOXYzine (ATARAX/VISTARIL) tablet 25 mg  25 mg Oral Q6H PRN Laverle Hobby, PA-C      . magnesium hydroxide (MILK OF MAGNESIA) suspension 30 mL  30 mL Oral Daily PRN Laverle Hobby, PA-C      . nicotine (  NICODERM CQ - dosed in mg/24 hours) patch 21 mg  21 mg Transdermal Daily Jenne Campus, MD   21 mg at 02/22/16 0843  . traZODone (DESYREL) tablet 50 mg  50 mg Oral QHS,MR X 1 Laverle Hobby, PA-C   50 mg at 02/21/16 2309    Lab Results:  Results for orders placed or performed during the hospital encounter of 02/20/16 (from the past 48 hour(s))  Rapid urine drug screen (hospital performed)     Status: Abnormal   Collection Time: 02/20/16  5:37 PM  Result Value Ref Range   Opiates NONE DETECTED NONE DETECTED   Cocaine NONE DETECTED NONE DETECTED   Benzodiazepines NONE DETECTED NONE DETECTED   Amphetamines NONE DETECTED NONE DETECTED   Tetrahydrocannabinol POSITIVE (A) NONE DETECTED   Barbiturates NONE DETECTED NONE DETECTED    Comment:         DRUG SCREEN FOR MEDICAL PURPOSES ONLY.  IF CONFIRMATION IS NEEDED FOR ANY PURPOSE, NOTIFY LAB WITHIN 5 DAYS.        LOWEST DETECTABLE LIMITS FOR URINE DRUG SCREEN Drug Class       Cutoff (ng/mL) Amphetamine      1000 Barbiturate      200 Benzodiazepine   324 Tricyclics       401 Opiates          300 Cocaine          300 THC              50   Comprehensive metabolic panel     Status: None   Collection Time: 02/20/16  5:51 PM  Result Value Ref Range   Sodium 139 135 - 145 mmol/L   Potassium 3.5 3.5 - 5.1 mmol/L   Chloride 109 101 - 111 mmol/L   CO2 25 22 - 32 mmol/L   Glucose, Bld 94 65 - 99 mg/dL   BUN 6 6 - 20 mg/dL   Creatinine, Ser 0.53 0.44 - 1.00 mg/dL   Calcium 9.0 8.9 - 10.3 mg/dL   Total Protein 7.5 6.5 - 8.1 g/dL   Albumin 4.8 3.5 - 5.0 g/dL   AST 16 15 - 41 U/L   ALT 15 14 - 54 U/L   Alkaline Phosphatase 56 38 - 126 U/L   Total Bilirubin 0.9 0.3 - 1.2 mg/dL   GFR calc non Af Amer >60 >60 mL/min   GFR calc Af Amer >60 >60 mL/min    Comment: (NOTE) The eGFR has been calculated using the CKD EPI equation. This calculation has not been validated in all clinical situations. eGFR's persistently <60 mL/min signify possible Chronic Kidney Disease.    Anion gap 5 5 - 15  Ethanol     Status: None   Collection Time: 02/20/16  5:51 PM  Result Value Ref Range   Alcohol, Ethyl (B) <5 <5 mg/dL    Comment:        LOWEST DETECTABLE LIMIT FOR SERUM ALCOHOL IS 5 mg/dL FOR MEDICAL PURPOSES ONLY   Salicylate level     Status: None   Collection Time: 02/20/16  5:51 PM  Result Value Ref Range   Salicylate Lvl <0.2 2.8 - 30.0 mg/dL  Acetaminophen level     Status: Abnormal   Collection Time: 02/20/16  5:51 PM  Result Value Ref Range   Acetaminophen (Tylenol), Serum <10 (L) 10 - 30 ug/mL    Comment:        THERAPEUTIC CONCENTRATIONS VARY SIGNIFICANTLY. A RANGE OF 10-30  ug/mL MAY BE AN EFFECTIVE CONCENTRATION FOR MANY PATIENTS. HOWEVER, SOME ARE BEST TREATED AT  CONCENTRATIONS OUTSIDE THIS RANGE. ACETAMINOPHEN CONCENTRATIONS >150 ug/mL AT 4 HOURS AFTER INGESTION AND >50 ug/mL AT 12 HOURS AFTER INGESTION ARE OFTEN ASSOCIATED WITH TOXIC REACTIONS.   cbc     Status: Abnormal   Collection Time: 02/20/16  5:51 PM  Result Value Ref Range   WBC 6.5 4.0 - 10.5 K/uL   RBC 4.82 3.87 - 5.11 MIL/uL   Hemoglobin 15.2 (H) 12.0 - 15.0 g/dL   HCT 44.8 36.0 - 46.0 %   MCV 92.9 78.0 - 100.0 fL   MCH 31.5 26.0 - 34.0 pg   MCHC 33.9 30.0 - 36.0 g/dL   RDW 12.8 11.5 - 15.5 %   Platelets 235 150 - 400 K/uL  POC urine preg, ED     Status: None   Collection Time: 02/20/16  8:02 PM  Result Value Ref Range   Preg Test, Ur NEGATIVE NEGATIVE    Comment:        THE SENSITIVITY OF THIS METHODOLOGY IS >24 mIU/mL     Blood Alcohol level:  Lab Results  Component Value Date   ETH <5 84/53/6468    Metabolic Disorder Labs: No results found for: HGBA1C, MPG No results found for: PROLACTIN No results found for: CHOL, TRIG, HDL, CHOLHDL, VLDL, LDLCALC  Physical Findings: AIMS: Facial and Oral Movements Muscles of Facial Expression: None, normal Lips and Perioral Area: None, normal Jaw: None, normal Tongue: None, normal,Extremity Movements Upper (arms, wrists, hands, fingers): None, normal Lower (legs, knees, ankles, toes): None, normal, Trunk Movements Neck, shoulders, hips: None, normal, Overall Severity Severity of abnormal movements (highest score from questions above): None, normal Incapacitation due to abnormal movements: None, normal Patient's awareness of abnormal movements (rate only patient's report): No Awareness, Dental Status Current problems with teeth and/or dentures?: No Does patient usually wear dentures?: No  CIWA:    COWS:     Musculoskeletal: Strength & Muscle Tone: within normal limits Gait & Station: normal Patient leans: no lean  Psychiatric Specialty Exam: Physical Exam  Constitutional: She appears well-developed and  well-nourished. No distress.  Skin: She is not diaphoretic.    Review of Systems  Cardiovascular: Negative for chest pain.  Gastrointestinal: Negative for nausea.  Psychiatric/Behavioral: Positive for depression. Negative for suicidal ideas.    Blood pressure 99/71, pulse 76, temperature 98.4 F (36.9 C), temperature source Oral, resp. rate 16, height 5' 2.5" (1.588 m), weight 60.3 kg (133 lb), last menstrual period 02/15/2016.Body mass index is 23.94 kg/m.  General Appearance: Casual  Eye Contact:  Good  Speech:  Normal Rate  Volume:  Normal  Mood:  Dysphoric  Affect:  Congruent  Thought Process:  Goal Directed  Orientation:  Full (Time, Place, and Person)  Thought Content:  Rumination  Suicidal Thoughts:  No  Homicidal Thoughts:  No  Memory:  Immediate;   Fair Recent;   Fair  Judgement:  Fair  Insight:  Shallow  Psychomotor Activity:  Normal  Concentration:  Concentration: Fair and Attention Span: Fair  Recall:  [employer] of Knowledge:  Fair  Language:  Fair  Akathisia:  No  Handed:  Right  AIMS (if indicated):     Assets:  Desire for Improvement  ADL's:  Intact  Cognition:  WNL  Sleep:  Number of Hours: 5.25     Treatment Plan Summary: Daily contact with patient to assess and evaluate symptoms and progress in treatment, Medication management  and Plan as follows   Major depression: continue lexapro 47m qd Insomnia: trazadone Break up : improve coping skills Marijuana use: denies regular use or concern but encouraged abstinenence and concerns Continue to attend groups Possible discharge in one or two days.    AMerian Capron MD 02/22/2016, 10:40 AM

## 2016-02-22 NOTE — Progress Notes (Signed)
Adult Psychoeducational Group Note  Date:  02/22/2016 Time:  9:23 PM  Group Topic/Focus:  Wrap-Up Group:   The focus of this group is to help patients review their daily goal of treatment and discuss progress on daily workbooks.   Participation Level:  Active  Participation Quality:  Appropriate  Affect:  Appropriate  Cognitive:  Alert  Insight: Appropriate  Engagement in Group:  Engaged  Modes of Intervention:  Discussion  Additional Comments:  Patient states, "I had a really good day". Patient's goal for today was "to stay positive". Patient met goal. Brianna Wilkinson L [name] 02/22/2016, 9:23 PM

## 2016-02-23 NOTE — Progress Notes (Signed)
D: Pt denies SI/HI/AVH. Pt is pleasant and cooperative. Pt appears to be minimizing her situation and not confronting the issues at hand.    A: Pt was offered support and encouragement. Pt was given scheduled medications. Pt was encourage to attend groups. Q 15 minute checks were done for safety.  R:Pt attends groups and interacts well with peers and staff. Pt is taking medication. Pt has no complaints.Pt receptive to treatment and safety maintained on unit.

## 2016-02-23 NOTE — Progress Notes (Signed)
Patient ID: Brianna Wilkinson, female   DOB: March 05, 1991, 25 y.o.   MRN: [id number] [facility] MD Progress Note  02/23/2016 5:00 PM Brianna Wilkinson  MRN:  [id number] Subjective:  Patient states she is feeling " better " and " hoping to go home soon ". States " I have had a difficult month", and reports moving twice within a month, including because of having a substance abusing roommate, difficulties at work due to [employer], getting a ticket for trespassing at her apartment swimming pool, recent break up with boyfriend, and recent miscarriage .  Patient denies medication side effects.  Objective : I have discussed case with treatment team and have met with patient. Patient reports improved mood, and reports she feels better. At this time presents with reactive, bright affect . She has been visible on the unit, going to groups, interacting appropriately with peers . Denies any further self injurious or suicidal ideations . Denies any thoughts of self cutting .   Principal Problem: <principal problem not specified> Diagnosis:   Patient Active Problem List   Diagnosis Date Noted  . MDD (major depressive disorder), severe ([place]) [F32.2] 02/21/2016   Total Time spent with patient: 20 minutes  Past Psychiatric History: depression  Past Medical History:  Past Medical History:  Diagnosis Date  . Anxiety   . Depression   . IBS (irritable bowel syndrome)     Past Surgical History:  Procedure Laterality Date  . WISDOM TOOTH EXTRACTION     Family History: History reviewed. No pertinent family history. Family Psychiatric  History: Mom depression and committed suicide.  Social History:  History  Alcohol Use No     History  Drug Use No    Social History   Social History  . Marital status: Single    Spouse name: N/A  . Number of children: N/A  . Years of education: N/A   Social History Main Topics  . Smoking status: Current Every Day Smoker    Packs/day: 1.00    Years: 4.00     Types: Cigarettes  . Smokeless tobacco: Never Used  . Alcohol use No  . Drug use: No  . Sexual activity: Yes    Birth control/ protection: Pill   Other Topics Concern  . None   Social History Narrative  . None   Additional Social History:    Pain Medications: Denies Prescriptions: Denies Over the Counter: Denies Name of Substance 1: THC 1 - Age of First Use: 17 1 - Amount (size/oz): a bowl 1 - Frequency: sometimes twice a week, then goes a month without smoking depends on stress 1 - Duration: ongoing 1 - Last Use / Amount: 3 days ago  Sleep: improved   Appetite: improved   Current Medications: Current Facility-Administered Medications  Medication Dose Route Frequency Provider Last Rate Last Dose  . acetaminophen (TYLENOL) tablet 650 mg  650 mg Oral Q6H PRN Laverle Hobby, PA-C   650 mg at 02/23/16 7829  . albuterol (PROVENTIL HFA;VENTOLIN HFA) 108 (90 Base) MCG/ACT inhaler 2 puff  2 puff Inhalation Q4H PRN Laverle Hobby, PA-C      . alum & mag hydroxide-simeth (MAALOX/MYLANTA) 200-200-20 MG/5ML suspension 30 mL  30 mL Oral Q4H PRN Laverle Hobby, PA-C      . escitalopram (LEXAPRO) tablet 20 mg  20 mg Oral Daily Laverle Hobby, PA-C   20 mg at 02/23/16 0840  . feeding supplement (ENSURE ENLIVE) (ENSURE ENLIVE) liquid 237 mL  237 mL Oral BID BM  Jenne Campus, MD      . hydrOXYzine (ATARAX/VISTARIL) tablet 25 mg  25 mg Oral Q6H PRN Laverle Hobby, PA-C   25 mg at 02/22/16 2244  . magnesium hydroxide (MILK OF MAGNESIA) suspension 30 mL  30 mL Oral Daily PRN Laverle Hobby, PA-C      . nicotine (NICODERM CQ - dosed in mg/24 hours) patch 21 mg  21 mg Transdermal Daily Jenne Campus, MD   21 mg at 02/23/16 0928  . traZODone (DESYREL) tablet 50 mg  50 mg Oral QHS,MR X 1 Spencer E Simon, PA-C   50 mg at 02/22/16 2244    Lab Results:  No results found for this or any previous visit (from the past 48 hour(s)).  Blood Alcohol level:  Lab Results  Component Value Date    ETH <5 16/04/9603    Metabolic Disorder Labs: No results found for: HGBA1C, MPG No results found for: PROLACTIN No results found for: CHOL, TRIG, HDL, CHOLHDL, VLDL, LDLCALC  Physical Findings: AIMS: Facial and Oral Movements Muscles of Facial Expression: Moderate Lips and Perioral Area: None, normal Jaw: Moderate Tongue: None, normal,Extremity Movements Upper (arms, wrists, hands, fingers): None, normal Lower (legs, knees, ankles, toes): Moderate, Trunk Movements Neck, shoulders, hips: Severe, Overall Severity Severity of abnormal movements (highest score from questions above): Severe Incapacitation due to abnormal movements: Severe Patient's awareness of abnormal movements (rate only patient's report): Aware, severe distress, Dental Status Current problems with teeth and/or dentures?: No Does patient usually wear dentures?: No  CIWA:    COWS:     Musculoskeletal: Strength & Muscle Tone: within normal limits Gait & Station: normal Patient leans: no lean  Psychiatric Specialty Exam: Physical Exam  Constitutional: She appears well-developed and well-nourished. No distress.  Skin: She is not diaphoretic.    Review of Systems  Cardiovascular: Negative for chest pain.  Gastrointestinal: Negative for nausea.  Psychiatric/Behavioral: Positive for depression. Negative for suicidal ideas.    Blood pressure 98/64, pulse 82, temperature 98.4 F (36.9 C), temperature source Oral, resp. rate 16, height 5' 2.5" (1.588 m), weight 133 lb (60.3 kg), last menstrual period 02/15/2016.Body mass index is 23.94 kg/m.  General Appearance: well groomed   Eye Contact:  Good  Speech:  Normal Rate  Volume:  Normal  Mood:  Improving mood, at this time presents euthymic   Affect:  Reactive   Thought Process:  Goal Directed  Orientation:  Full (Time, Place, and Person)  Thought Content:  No hallucinations, no delusions   Suicidal Thoughts:  No denies any suicidal ideations or self injurious  ideations at this time , contracts for safety on the unit   Homicidal Thoughts:  No- denies any violent or homicidal ideations   Memory:   Recent and remote grossly intact   Judgement:  Improving   Insight:  Improving   Psychomotor Activity:  Normal  Concentration:  Concentration: Good and Attention Span: Good  Recall:  Good  Fund of Knowledge:  Good  Language:  Good  Akathisia:  No  Handed:  Right  AIMS (if indicated):     Assets:  Desire for Improvement  ADL's:  Intact  Cognition:  WNL  Sleep:  Number of Hours: 5.75   Assessment - patient reports improving mood, and currently minimizes depression and presents euthymic. Currently denies any suicidal ideations . Denies any medication side effects. As she improves she is hoping for discharge soon . Patient states that she has a history of regular cannabis  use, sometimes daily, states " I have been wanting to stop, I know it is not good for me".    Treatment Plan Summary: Daily contact with patient to assess and evaluate symptoms and progress in treatment, Medication management and Plan as follows   Continue Trazodone 50 mgrs QHS PRN for insomnia , as needed  Continue Lexapro 20 mgrs QDAY for depression, anxiety.  Continue to encourage cannabis abstinence. Continue to encourage group and milieu participation to work on coping skills and symptom reduction.  Treatment team working on disposition planning .   Neita Garnet, MD 02/23/2016, 5:00 PM

## 2016-02-23 NOTE — Tx Team (Signed)
Interdisciplinary Treatment Plan Update (Adult) Date: 02/23/2016   Date: 02/23/2016 9:50 AM  Progress in Treatment:  Attending groups: Yes  Participating in groups: Yes  Taking medication as prescribed: Yes  Tolerating medication: Yes  Family/Significant othe contact made: No, CSW attempting to make contact with father Patient understands diagnosis: Yes AEB seeking help with depression and anxiety Discussing patient identified problems/goals with staff: Yes  Medical problems stabilized or resolved: Yes  Denies suicidal/homicidal ideation: Yes Patient has not harmed self or Others: Yes   New problem(s) identified: None identified at this time.   Discharge Plan or Barriers: Pt will return home and follow-up with outpatient services.   Additional comments:  Patient and CSW reviewed pt's identified goals and treatment plan. Patient verbalized understanding and agreed to treatment plan.   Reason for Continuation of Hospitalization:  Anxiety Depression Medication stabilization Suicidal ideation   Estimated length of stay: 1 day  Review of initial/current patient goals per problem list:   1.  Goal(s): Patient will participate in aftercare plan  Met:  Yes  Target date: 3-5 days from date of admission   As evidenced by: Patient will participate within aftercare plan AEB aftercare provider and housing plan at discharge being identified.   02/23/16: Pt will return home and follow-up with outpatient services.   2.  Goal (s): Patient will exhibit decreased depressive symptoms and suicidal ideations.  Met:  Yes  Target date: 3-5 days from date of admission   As evidenced by: Patient will utilize self rating of depression at 3 or below and demonstrate decreased signs of depression or be deemed stable for discharge by MD.  02/23/16: Pt rates depression at 2/10; denies SI  3.  Goal(s): Patient will demonstrate decreased signs and symptoms of anxiety.  Met:  Yes  Target date: 3-5 days  from date of admission   As evidenced by: Patient will utilize self rating of anxiety at 3 or below and demonstrated decreased signs of anxiety, or be deemed stable for discharge by MD  02/23/16: Pt rates anxiety at 0/10  Attendees:  Patient:    Family:    Physician: Dr. Cobos, MD  02/23/2016 9:50 AM  Nursing: Britney Tyson, RN; Patrice White, RN 02/23/2016 9:50 AM  Clinical Social Worker Lauren Carter, LCSWA 02/23/2016 9:50 AM  Other: Kristin Drinkard, LCSWA 02/23/2016 9:50 AM  Clinical: Jennifer Clark, RN Case manager  02/23/2016 9:50 AM  Other:  02/23/2016 9:50 AM  Other:     Lauren Carter, LCSWA Clinical Social Work [phone]      

## 2016-02-23 NOTE — Progress Notes (Signed)
Adult Psychoeducational Group Note  Date:  02/23/2016 Time:  4:35 PM  Group Topic/Focus:  Wellness Toolbox:   The focus of this group is to discuss various aspects of wellness, balancing those aspects and exploring ways to increase the ability to experience wellness.  Patients will create a wellness toolbox for use upon discharge.   Participation Level:  Active  Participation Quality:  Appropriate  Affect:  Appropriate  Cognitive:  Appropriate  Insight: Appropriate  Engagement in Group:  Engaged  Modes of Intervention:  Discussion  Additional Comments: pt attended group this morning. Pt goal for today is to work on finding coping skills to deal with relationship stress. Pt denies SI/HI at this time. Pt rated her day 6/10. Today's topic is wellness. Pt discussed with peers her main stressors. Pt shared her main stressor is her recent break up with her long term boyfriend. Pt stated " I don't know how to cope without having him in my life". " I feel like I lost a part of me when he broke up with me". Pt also talked about ways she stays healthy. Pt likes to workout and eat health.  Suzie Vandam A 02/23/2016, 4:35 PM

## 2016-02-23 NOTE — Progress Notes (Signed)
D: Pt presents animated with an anxious mood. Pt appears to be minimizing her depression this morning. Pt rates depression 1/10. Pt denies suicidal thoughts. Pt reports that she was overwhelmed prior to admission after she was charged with trespassing, couldn't take time off from work to go visit her father, best friend and ex boyfriend are moving away and she also thinks about her mom who committed suicide when she was 25 years old. Pt reports that she have a positive support system and identified her sister and father as supportive. Pt compliant with taking meds and attending groups.  A: Medications reviewed with pt. Medications administered as ordered per MD. Verbal support provided. Pt encouraged to attend groups. 15 minute checks performed for safety.  R: Pt verbalizes understanding of med regimen. Pt receptive to tx. Pt stated goal "being myself and happy".

## 2016-02-23 NOTE — Progress Notes (Signed)
Adult Psychoeducational Group Note  Date:  02/23/2016 Time:  8:45 PM  Group Topic/Focus:  Wrap-Up Group:   The focus of this group is to help patients review their daily goal of treatment and discuss progress on daily workbooks.   Participation Level:  Active  Participation Quality:  Appropriate  Affect:  Appropriate  Cognitive:  Appropriate  Insight: Good  Engagement in Group:  Engaged  Modes of Intervention:  Discussion  Additional Comments:  Pt rated her overall day a 9 out of 10 and stated that her day was "good" because she did a good job of staying positive today. Pt reported that she achieved her goal for the day, which was to work on her discharge plans as well as stay positive.    Cleotilde NeerJasmine S Seymour Pavlak 02/23/2016, 9:26 PM

## 2016-02-23 NOTE — [facility] Group Notes (Signed)
[facility] LCSW Group Therapy  02/23/2016 1:15pm  Type of Therapy:  Group Therapy vercoming Obstacles  Participation Level:  Active  Participation Quality:  Appropriate   Affect:  Appropriate  Cognitive:  Appropriate and Oriented  Insight:  Developing/Improving and Improving  Engagement in Therapy:  Improving  Modes of Intervention:  Discussion, Exploration, Problem-solving and Support  Description of Group:   In this group patients will be encouraged to explore what they see as obstacles to their own wellness and recovery. They will be guided to discuss their thoughts, feelings, and behaviors related to these obstacles. The group will process together ways to cope with barriers, with attention given to specific choices patients can make. Each patient will be challenged to identify changes they are motivated to make in order to overcome their obstacles. This group will be process-oriented, with patients participating in exploration of their own experiences as well as giving and receiving support and challenge from other group members.  Summary of Patient Progress: Pt identified "feeling stuck" as her obstacle, pointing to financial and employment difficulties that cause this obstacle. Pt described negative self-talk and difficulty setting boundaries which cause her to feel underappreciated. Pt expressed moderate motivation to overcome obstacles and was able to identify the need for self-confidence in overcoming obstacles.    Therapeutic Modalities:   Cognitive Behavioral Therapy Solution Focused Therapy Motivational Interviewing Relapse Prevention Therapy   Chad CordialLauren Carter, LCSWA 02/23/2016 2:57 PM

## 2016-02-23 NOTE — Progress Notes (Signed)
Recreation Therapy Notes  Date: 02/23/16 Time: 0930 Location: 300 Hall Group Room  Group Topic: Stress Management  Goal Area(s) Addresses:  Patient will verbalize importance of using healthy stress management.  Patient will identify positive emotions associated with healthy stress management.   Intervention: Stress Management  Activity :  Forest Visualization.  LRT introduced pt to the technique of guided imagery.  Patients were to follow along as LRT read script to engage in the activity.   Education:  Stress Management, Discharge Planning.   Clinical Observations/Feedback: Pt did not attend group.     Jasiya Markie, LRT/CTRS  

## 2016-02-23 NOTE — [facility] Group Notes (Signed)
[facility] Note  02/23/2016 8:45 AM  Participation Quality: Alert, Appropriate and Oriented  Mood/Affect: Flat  Depression Rating: 2  Anxiety Rating: 0  Thoughts of Suicide: Pt denies SI/HI  Will you contract for safety? Yes  Current AVH: Pt denies  Plan for Discharge/Comments: Pt attended discharge planning group and actively participated in group. CSW discussed suicide prevention education with the group and encouraged them to discuss discharge planning and any relevant barriers. Pt reports that she is feeling better and is hoping to discharge tomorrow. She is requesting a therapist that is in network with her insurance.   Transportation Means: Pt reports access to transportation  Supports: No supports mentioned at this time  Chad CordialLauren Carter, LCSWA 02/23/2016 9:31 AM

## 2016-02-24 MED ORDER — ALBUTEROL SULFATE HFA 108 (90 BASE) MCG/ACT IN AERS: 2.0000 | INHALATION_SPRAY | RESPIRATORY_TRACT | 0 refills | Status: DC | PRN

## 2016-02-24 MED ORDER — ESCITALOPRAM OXALATE 20 MG PO TABS: 20.0000 mg | ORAL_TABLET | Freq: Every day | ORAL | 0 refills | Status: DC

## 2016-02-24 MED ORDER — TRAZODONE HCL 50 MG PO TABS: 50.0000 mg | ORAL_TABLET | Freq: Every evening | ORAL | 0 refills | Status: DC | PRN

## 2016-02-24 MED ORDER — HYDROXYZINE HCL 25 MG PO TABS: 25.0000 mg | ORAL_TABLET | Freq: Four times a day (QID) | ORAL | 0 refills | Status: DC | PRN

## 2016-02-24 MED ORDER — NICOTINE 21 MG/24HR TD PT24: 21.0000 mg | MEDICATED_PATCH | Freq: Every day | TRANSDERMAL | 0 refills | Status: DC

## 2016-02-24 NOTE — Progress Notes (Signed)
  [facility] :  Will you be returning to the same living situation after discharge:  Yes,  Pt returning home At discharge, do you have transportation home?: Yes,  Pt friend to pick up Do you have the ability to pay for your medications: Yes,  Pt provided with prescriptions  Release of information consent forms completed and in the chart;  Patient's signature needed at discharge.  Patient to Follow up at: Follow-up Information    [facility].   Why:  at 3:45pm with Laurence Alyyan Miller for medication management Contact information: [address] [place], [place] [address] [phone] Fax: [phone]       [facility] [address] Follow up on 03/03/2016.   Specialty:  Behavioral Health Why:  at 3:00pm with Dr. Dellia CloudGutterman. Please arrive approximately 10 minutes early to fill out paperwork. If you need to cancel or reschedule, please call at least 24hrs in advance to avoid being charged a fee.   [address] [place]  Contact information: [address] [place] [phone]          Next level of care provider has access to [facility] Link:no  Safety Planning and Suicide Prevention discussed: Yes,  with father; see SPE note  Have you used any form of tobacco in the last 30 days? (Cigarettes, Smokeless Tobacco, Cigars, and/or Pipes): Yes  Has patient been referred to the Quitline?: Patient refused referral  Patient has been referred for addiction treatment: Yes  Elaina HoopsCarter, Earnest Mcgillis M 02/24/2016, 8:54 AM

## 2016-02-24 NOTE — [facility] Suicide Risk Assessment (Signed)
[facility] INPATIENT:  Family/Significant Other Suicide Prevention Education  Suicide Prevention Education:  Education Completed; Brianna Wilkinson, Pt's father, has been identified by the patient as the family member/significant other with whom the patient will be residing, and identified as the person(s) who will aid the patient in the event of a mental health crisis (suicidal ideations/suicide attempt).  With written consent from the patient, the family member/significant other has been provided the following suicide prevention education, prior to the and/or following the discharge of the patient.  The suicide prevention education provided includes the following:  Suicide risk factors  Suicide prevention and interventions  National Suicide Hotline telephone number  [facility] assessment telephone number  [facility] Emergency Assistance 911  [facility] and/or Residential Mobile Crisis Unit telephone number  Request made of family/significant other to:  Remove weapons (e.g., guns, rifles, knives), all items previously/currently identified as safety concern.    Remove drugs/medications (over-the-counter, prescriptions, illicit drugs), all items previously/currently identified as a safety concern.  The family member/significant other verbalizes understanding of the suicide prevention education information provided.  The family member/significant other agrees to remove the items of safety concern listed above.  Brianna Wilkinson, Brianna Wilkinson 02/24/2016, 11:41 AM

## 2016-02-24 NOTE — [facility] Group Notes (Signed)
[facility] Group Notes:  (Nursing/MHT/Case Management/Adjunct)  Date:  02/24/2016  Time:  9:04 AM  Type of Therapy:  Psychoeducational Skills  Participation Level:  Active  Participation Quality:  Appropriate and Attentive  Affect:  Appropriate  Cognitive:  Alert and Appropriate  Insight:  Appropriate  Engagement in Group:  Developing/Improving  Modes of Intervention:  Support  Summary of Progress/Problems: Patient states she is being discharged today.  Informed her discharges are usually after lunch.  Cranford MonBeaudry, Dawon Troop Evans 02/24/2016, 9:04 AM

## 2016-02-24 NOTE — Tx Team (Signed)
Interdisciplinary Treatment Plan Update (Adult) Date: 02/24/2016   Date: 02/24/2016 8:53 AM  Progress in Treatment:  Attending groups: Yes  Participating in groups: Yes  Taking medication as prescribed: Yes  Tolerating medication: Yes  Family/Significant othe contact made: Yes with father Patient understands diagnosis: Yes AEB seeking help with depression and anxiety Discussing patient identified problems/goals with staff: Yes  Medical problems stabilized or resolved: Yes  Denies suicidal/homicidal ideation: Yes Patient has not harmed self or Others: Yes   New problem(s) identified: None identified at this time.   Discharge Plan or Barriers: Pt will return home and follow-up with outpatient services.   Additional comments:  Patient and CSW reviewed pt's identified goals and treatment plan. Patient verbalized understanding and agreed to treatment plan.   Reason for Continuation of Hospitalization:  Anxiety Depression Medication stabilization Suicidal ideation   Estimated length of stay: 0 days  Review of initial/current patient goals per problem list:   1.  Goal(s): Patient will participate in aftercare plan  Met:  Yes  Target date: 3-5 days from date of admission   As evidenced by: Patient will participate within aftercare plan AEB aftercare provider and housing plan at discharge being identified.   02/23/16: Pt will return home and follow-up with outpatient services.   2.  Goal (s): Patient will exhibit decreased depressive symptoms and suicidal ideations.  Met:  Yes  Target date: 3-5 days from date of admission   As evidenced by: Patient will utilize self rating of depression at 3 or below and demonstrate decreased signs of depression or be deemed stable for discharge by MD.  02/23/16: Pt rates depression at 2/10; denies SI  3.  Goal(s): Patient will demonstrate decreased signs and symptoms of anxiety.  Met:  Yes  Target date: 3-5 days from date of admission   As  evidenced by: Patient will utilize self rating of anxiety at 3 or below and demonstrated decreased signs of anxiety, or be deemed stable for discharge by MD  02/23/16: Pt rates anxiety at 0/10  Attendees:  Patient:    Family:    Physician: Dr. Parke Poisson, MD  02/24/2016 8:53 AM  Nursing: Mayra Neer, RN; Kerby Nora, RN 02/24/2016 8:53 AM  Clinical Social Worker Peri Maris, [place] 02/24/2016 8:53 AM  Other: Tilden Fossa, [place] 02/24/2016 8:53 AM  Clinical: 02/24/2016 8:53 AM  Other:  02/24/2016 8:53 AM  Other:     Peri Maris, [place] Work [phone]

## 2016-02-24 NOTE — Discharge Summary (Signed)
Physician Discharge Summary Note  Patient:  Brianna Torryis an 25 y.o., female MRN:  [id number] DOB:  1990-10-07 Patient phone:  [phone] (home)  Patient address:   [address] [place] [address],  Total Time spent with patient: Greater than 30 minutes  Date of Admission:  02/21/2016 Date of Discharge: 02-24-16  Reason for Admission:  Suicidal ideations & self laceration to her wrist.  Principal Problem: Worsening symptoms of depression  Discharge Diagnoses: Patient Active Problem List   Diagnosis Date Noted  . MDD (major depressive disorder), severe (HCC) [F32.2] 02/21/2016   Past Psychiatric History: Major depressive disorder.  Past Medical History:  Past Medical History:  Diagnosis Date  . Anxiety   . Depression   . IBS (irritable bowel syndrome)     Past Surgical History:  Procedure Laterality Date  . WISDOM TOOTH EXTRACTION     Family History: History reviewed. No pertinent family history.  Family Psychiatric  History: See H&P  Social History:  History  Alcohol Use No     History  Drug Use No    Social History   Social History  . Marital status: Single    Spouse name: N/A  . Number of children: N/A  . Years of education: N/A   Social History Main Topics  . Smoking status: Current Every Day Smoker    Packs/day: 1.00    Years: 4.00    Types: Cigarettes  . Smokeless tobacco: Never Used  . Alcohol use No  . Drug use: No  . Sexual activity: Yes    Birth control/ protection: Pill   Other Topics Concern  . None   Social History Narrative  . None   Hospital Course: Brianna Wilkinson an 25 y.o.femalepresenting voluntarily to [facility] for suicidal ideations and lacerations to her right wrist. Patient was depressed and overwhelmed with significant stresors in he life including, break up with boy friend, recent miscarriage. She states that her best friend is moving to [place] with her previous ex-boyfriend and states "they are like  my second family." Patient states that her family lives far away and states that the closest family member is three and a half hours away. Patient states that her mother committed suicide when she was [age] years of age. She states that when her best friends moves away she will not have any support. Patient states"I guess it finally hit me with everything that happened this week and I didn't want to do it anymore."   Brianna Wilkinson was admitted to the hospital for worsening symptoms of depression & crisis management due to self-inflicted laceration to her wrist. She cited lack of support system as the trigger. She was in need of mood stabilization treatment. After her admission assessment, Brianna Wilkinson's presenting symptoms were identified. The medication regimen targeting those symptoms were initiated. She was medicated & discharged on; Lexapro 20 mg for depression, Nicotine patch for nicotine withdrawal symptoms & Trazodone 50 mg for insomnia. She presented no other significant pre-existing medical problems that required treatment. She was enrolled & participated in the group counseling sessions being offered & held on this unit. She learned coping skills..  During the course of her hospitalization, Brianna Wilkinson's improvement was monitored by observation & her daily report of symptom reduction. Her emotional & mental status was monitored by daily self-inventory reports completed by her & the clinical staff. She was evaluated by the treatment team for stability and plans for continued recovery after discharge. Brianna Wilkinson's motivation was an integral factor in her mood  stability. She was offered further treatment options upon discharge on an outpatient basis as noted below.    Upon discharge, Brianna Wilkinson was both mentally and medically stable. She denies suicidal/homicidal ideations, auditory/visual/tactile hallucinations, delusional thoughts or paranoia. [facility] distress. Transportation per  friend.  Physical Findings: AIMS: Facial and Oral Movements Muscles of Facial Expression: Moderate Lips and Perioral Area: None, normal Jaw: Moderate Tongue: None, normal,Extremity Movements Upper (arms, wrists, hands, fingers): None, normal Lower (legs, knees, ankles, toes): Moderate, Trunk Movements Neck, shoulders, hips: Severe, Overall Severity Severity of abnormal movements (highest score from questions above): Severe Incapacitation due to abnormal movements: Severe Patient's awareness of abnormal movements (rate only patient's report): Aware, severe distress, Dental Status Current problems with teeth and/or dentures?: No Does patient usually wear dentures?: No  CIWA:    COWS:     Musculoskeletal: Strength & Muscle Tone: within normal limits Gait & Station: normal Patient leans: N/A  Psychiatric Specialty Exam: Physical Exam  Constitutional: She appears well-developed.  HENT:  Head: Normocephalic.  Eyes: Pupils are equal, round, and reactive to light.  Neck: Normal range of motion.  Cardiovascular: Normal rate.   Respiratory: Effort normal.  GI: Soft.  Genitourinary:  Genitourinary Comments: Denies any issues in this area  Musculoskeletal: Normal range of motion.  Neurological: She is alert.  Skin: Skin is warm.    Review of Systems  Constitutional: Negative.   HENT: Negative.   Eyes: Negative.   Respiratory: Negative.   Cardiovascular: Negative.   Gastrointestinal: Negative.   Genitourinary: Negative.   Musculoskeletal: Negative.   Skin: Negative.   Neurological: Negative.   Endo/Heme/Allergies: Negative.   Psychiatric/Behavioral: Positive for depression (Stable) and substance abuse (Hx. THC dependence). Negative for hallucinations, memory loss and suicidal ideas. The patient has insomnia (Stable). The patient is not nervous/anxious.     Blood pressure 107/77, pulse 91, temperature 98.8 F (37.1 C), temperature source Oral, resp. rate 16, height 5' 2.5"  (1.588 m), weight 60.3 kg (133 lb), last menstrual period 02/15/2016.Body mass index is 23.94 kg/m.  See Md's SRA   Have you used any form of tobacco in the last 30 days? (Cigarettes, Smokeless Tobacco, Cigars, and/or Pipes): Yes  Has this patient used any form of tobacco in the last 30 days? (Cigarettes, Smokeless Tobacco, Cigars, and/or Pipes): Yes, Provided with Nicotine patch prescription.  Blood Alcohol level:  Lab Results  Component Value Date   ETH <5 02/20/2016   Metabolic Disorder Labs:  No results found for: HGBA1C, MPG No results found for: PROLACTIN No results found for: CHOL, TRIG, HDL, CHOLHDL, VLDL, LDLCALC  See Psychiatric Specialty Exam and Suicide Risk Assessment completed by Attending Physician prior to discharge.  Discharge destination:  Home  Is patient on multiple antipsychotic therapies at discharge:  No   Has Patient had three or more failed trials of antipsychotic monotherapy by history:  No  Recommended Plan for Multiple Antipsychotic Therapies: NA    Medication List    STOP taking these medications   amphetamine-dextroamphetamine 20 MG tablet Commonly known as:  ADDERALL   calcium-vitamin D 250-125 MG-UNIT tablet Commonly known as:  OSCAL WITH D   dicyclomine 20 MG tablet Commonly known as:  BENTYL     TAKE these medications     Indication  albuterol 108 (90 Base) MCG/ACT inhaler Commonly known as:  PROVENTIL HFA;VENTOLIN HFA Inhale 2 puffs into the lungs every 4 (four) hours as needed for wheezing.  Indication:  Asthma  escitalopram 20 MG tablet Commonly known as:  LEXAPRO Take 1 tablet (20 mg total) by mouth daily. For depression What changed:  additional instructions  Indication:  Major Depressive Disorder   hydrOXYzine 25 MG tablet Commonly known as:  ATARAX/VISTARIL Take 1 tablet (25 mg total) by mouth every 6 (six) hours as needed for anxiety.  Indication:  Anxiety Neurosis   nicotine 21 mg/24hr patch Commonly known as:   NICODERM CQ - dosed in mg/24 hours Place 1 patch (21 mg total) onto the skin daily. For smoking cessation  Indication:  Nicotine Addiction   traZODone 50 MG tablet Commonly known as:  DESYREL Take 1 tablet (50 mg total) by mouth at bedtime and may repeat dose one time if needed.  Indication:  Trouble Sleeping      Follow-up Information    [facility] Follow up on 03/12/2016.   Why:  at 3:45pm with Laurence Aly for medication management Contact information: [address] [place], [place] [address] [phone] Fax: [phone]       [facility] Follow up on 03/03/2016.   Specialty:  Behavioral Health Why:  at 3:00pm with Dr. Dellia Cloud. Please arrive approximately 10 minutes early to fill out paperwork. If you need to cancel or reschedule, please call at least 24hrs in advance to avoid being charged a fee.   [address] [place] [address] Ginette Otto [place] Contact information: [address] [place] [address] [phone]         Follow-up recommendations: Activity:  As tolerated Diet: As recommended by your primary care doctor. Keep all scheduled follow-up appointments as recommended.   Comments: Patient is instructed prior to discharge to: Take all medications as prescribed by his/her mental healthcare provider. Report any adverse effects and or reactions from the medicines to his/her outpatient provider promptly. Patient has been instructed & cautioned: To not engage in alcohol and or illegal drug use while on prescription medicines. In the event of worsening symptoms, patient is instructed to call the crisis hotline, 911 and or go to the nearest ED for appropriate evaluation and treatment of symptoms. To follow-up with his/her primary care provider for your other medical issues, concerns and or health care needs.   Signed: Sanjuana Kava, NP, PMHNP, FNP-BC 02/24/2016, 11:01 AM   Patient seen, Suicide Assessment Completed.   Disposition Plan Reviewed

## 2016-02-24 NOTE — [facility] Suicide Risk Assessment (Signed)
[facility] Discharge Suicide Risk Assessment   Principal Problem: <principal problem not specified> Discharge Diagnoses:  Patient Active Problem List   Diagnosis Date Noted  . MDD (major depressive disorder), severe (HCC) [F32.2] 02/21/2016    Total Time spent with patient: 30 minutes  Musculoskeletal: Strength & Muscle Tone: within normal limits Gait & Station: normal Patient leans: N/A  Psychiatric Specialty Exam: ROS no headache, no chest pain, mild nausea, no vomiting, no diarrhea, no rash   Blood pressure 107/77, pulse 91, temperature 98.8 F (37.1 C), temperature source Oral, resp. rate 16, height 5' 2.5" (1.588 m), weight 133 lb (60.3 kg), last menstrual period 02/15/2016.Body mass index is 23.94 kg/m.  General Appearance: Well Groomed  Eye Contact::  Good  Speech:  Normal Rate409  Volume:  Normal  Mood:  improved   Affect:  reactive, appropriate  Thought Process:  Linear  Orientation:  Full (Time, Place, and Person)  Thought Content:  denies hallucinations, no delusions , not internally preoccupied   Suicidal Thoughts:  No denies any suicidal ideations, no self injurious ideations   Homicidal Thoughts:  No denies any homicidal or violent ideations   Memory:  recent and remote grossly intact   Judgement:  Other:  improved   Insight:  improved   Psychomotor Activity:  Normal  Concentration:  Good  Recall:  Good  Fund of Knowledge:Good  Language: Good  Akathisia:  No  Handed:  Right  AIMS (if indicated):     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  Sleep:  Number of Hours: 5.5  Cognition: WNL  ADL's:  Intact   Mental Status Per Nursing Assessment::   On Admission:  Self-harm behaviors  Demographic Factors:  25 year old single female   Loss Factors: Recent break up with BF, recent moves from one apt to another due to roommate being dependent on drugs, recent miscarriage  Historical Factors: No prior suicide attempts, no history of prior  psychiatric admissions   Risk Reduction Factors:   Sense of responsibility to family, Living with another person, especially a relative, Positive coping skills or problem solving skills and in college   Continued Clinical Symptoms:  At this time patient is alert, attentive, well related, mood is euthymic, affect appropriate, reactive, no thought disorder, no SI or HI, no psychotic symptoms, future oriented . Denies medication side effects   Cognitive Features That Contribute To Risk:  No gross cognitive deficits noted upon discharge. Is alert , attentive, and oriented x 3   Suicide Risk:  Mild:  Suicidal ideation of limited frequency, intensity, duration, and specificity.  There are no identifiable plans, no associated intent, mild dysphoria and related symptoms, good self-control (both objective and subjective assessment), few other risk factors, and identifiable protective factors, including available and accessible social support.  Follow-up Information    [facility] Follow up on 03/12/2016.   Why:  at 3:45pm with Laurence Aly for medication management Contact information: [address] [place], [place] [address] [phone] Fax: [phone]       [facility] Follow up on 03/03/2016.   Specialty:  Behavioral Health Why:  at 3:00pm with Dr. Dellia Cloud. Please arrive approximately 10 minutes early to fill out paperwork. If you need to cancel or reschedule, please call at least 24hrs in advance to avoid being charged a fee.   [address] Ginette Otto [place] Contact information: [address] [place] [address] [phone]  Plan Of Care/Follow-up recommendations:  Activity:  as tolerated Diet:  Regular  Tests:  NA Other:  see below  Patient is leaving unit in good spirits  Plans to return home Plans to follow up as above  Nehemiah MassedOBOS, FERNANDO, MD 02/24/2016, 10:15 AM

## 2016-02-24 NOTE — Progress Notes (Signed)
Discharge note:  Patient discharged home per MD order.  Patient received discharge instructions.  Reviewed AVS and follow up appointments with patient and she indicated understanding.  She denies SI/HI/AVH.  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 Patient received all personal belongings from room and unit.  She left ambulatory with a friend.

## 2016-03-03 ENCOUNTER — Ambulatory Visit (INDEPENDENT_AMBULATORY_CARE_PROVIDER_SITE_OTHER): Payer: Managed Care, Other (non HMO) | Admitting: Psychology

## 2016-03-03 DIAGNOSIS — F331 Major depressive disorder, recurrent, moderate: Secondary | ICD-10-CM | POA: Diagnosis not present

## 2016-03-19 ENCOUNTER — Ambulatory Visit (INDEPENDENT_AMBULATORY_CARE_PROVIDER_SITE_OTHER): Payer: Managed Care, Other (non HMO) | Admitting: Psychology

## 2016-03-19 DIAGNOSIS — F331 Major depressive disorder, recurrent, moderate: Secondary | ICD-10-CM

## 2016-04-06 ENCOUNTER — Ambulatory Visit (INDEPENDENT_AMBULATORY_CARE_PROVIDER_SITE_OTHER): Payer: Managed Care, Other (non HMO) | Admitting: Psychology

## 2016-04-06 DIAGNOSIS — F331 Major depressive disorder, recurrent, moderate: Secondary | ICD-10-CM

## 2016-04-20 ENCOUNTER — Ambulatory Visit (INDEPENDENT_AMBULATORY_CARE_PROVIDER_SITE_OTHER): Payer: Managed Care, Other (non HMO) | Admitting: Psychology

## 2016-04-20 DIAGNOSIS — F331 Major depressive disorder, recurrent, moderate: Secondary | ICD-10-CM

## 2016-05-11 ENCOUNTER — Ambulatory Visit: Payer: Managed Care, Other (non HMO) | Admitting: Psychology

## 2016-05-24 ENCOUNTER — Ambulatory Visit: Payer: Self-pay | Admitting: Psychology

## 2016-05-26 ENCOUNTER — Ambulatory Visit (INDEPENDENT_AMBULATORY_CARE_PROVIDER_SITE_OTHER): Payer: Managed Care, Other (non HMO) | Admitting: Psychology

## 2016-05-26 DIAGNOSIS — F331 Major depressive disorder, recurrent, moderate: Secondary | ICD-10-CM | POA: Diagnosis not present

## 2016-06-07 ENCOUNTER — Ambulatory Visit (INDEPENDENT_AMBULATORY_CARE_PROVIDER_SITE_OTHER): Payer: Managed Care, Other (non HMO) | Admitting: Psychology

## 2016-06-07 DIAGNOSIS — F331 Major depressive disorder, recurrent, moderate: Secondary | ICD-10-CM

## 2016-09-23 ENCOUNTER — Ambulatory Visit: Payer: Managed Care, Other (non HMO) | Admitting: Psychology

## 2016-10-26 ENCOUNTER — Ambulatory Visit: Payer: Managed Care, Other (non HMO) | Admitting: Psychology

## 2016-11-22 ENCOUNTER — Ambulatory Visit (INDEPENDENT_AMBULATORY_CARE_PROVIDER_SITE_OTHER): Payer: Managed Care, Other (non HMO) | Admitting: Psychology

## 2016-11-22 DIAGNOSIS — F331 Major depressive disorder, recurrent, moderate: Secondary | ICD-10-CM | POA: Diagnosis not present

## 2016-11-29 ENCOUNTER — Ambulatory Visit (INDEPENDENT_AMBULATORY_CARE_PROVIDER_SITE_OTHER): Payer: Managed Care, Other (non HMO) | Admitting: Psychology

## 2016-11-29 DIAGNOSIS — F331 Major depressive disorder, recurrent, moderate: Secondary | ICD-10-CM | POA: Diagnosis not present

## 2017-05-02 ENCOUNTER — Ambulatory Visit (INDEPENDENT_AMBULATORY_CARE_PROVIDER_SITE_OTHER): Payer: 59 | Admitting: Psychology

## 2017-05-02 DIAGNOSIS — F331 Major depressive disorder, recurrent, moderate: Secondary | ICD-10-CM | POA: Diagnosis not present

## 2017-06-20 ENCOUNTER — Ambulatory Visit (INDEPENDENT_AMBULATORY_CARE_PROVIDER_SITE_OTHER): Payer: 59 | Admitting: Psychology

## 2017-06-20 DIAGNOSIS — F331 Major depressive disorder, recurrent, moderate: Secondary | ICD-10-CM

## 2017-07-25 ENCOUNTER — Ambulatory Visit (INDEPENDENT_AMBULATORY_CARE_PROVIDER_SITE_OTHER): Payer: 59 | Admitting: Psychology

## 2017-07-25 DIAGNOSIS — F3181 Bipolar II disorder: Secondary | ICD-10-CM | POA: Diagnosis not present

## 2017-08-08 ENCOUNTER — Ambulatory Visit: Payer: 59 | Admitting: Psychology

## 2017-08-16 ENCOUNTER — Ambulatory Visit (INDEPENDENT_AMBULATORY_CARE_PROVIDER_SITE_OTHER): Payer: 59 | Admitting: Psychology

## 2017-08-16 DIAGNOSIS — F331 Major depressive disorder, recurrent, moderate: Secondary | ICD-10-CM

## 2017-08-22 ENCOUNTER — Ambulatory Visit: Payer: 59 | Admitting: Psychology

## 2017-09-05 ENCOUNTER — Ambulatory Visit (INDEPENDENT_AMBULATORY_CARE_PROVIDER_SITE_OTHER): Payer: 59 | Admitting: Psychology

## 2017-09-05 DIAGNOSIS — F3181 Bipolar II disorder: Secondary | ICD-10-CM

## 2017-09-19 ENCOUNTER — Ambulatory Visit (INDEPENDENT_AMBULATORY_CARE_PROVIDER_SITE_OTHER): Payer: 59 | Admitting: Psychology

## 2017-09-19 DIAGNOSIS — F3181 Bipolar II disorder: Secondary | ICD-10-CM | POA: Diagnosis not present

## 2017-10-03 ENCOUNTER — Ambulatory Visit (INDEPENDENT_AMBULATORY_CARE_PROVIDER_SITE_OTHER): Payer: 59 | Admitting: Psychology

## 2017-10-03 DIAGNOSIS — F331 Major depressive disorder, recurrent, moderate: Secondary | ICD-10-CM | POA: Diagnosis not present

## 2017-10-17 ENCOUNTER — Ambulatory Visit (INDEPENDENT_AMBULATORY_CARE_PROVIDER_SITE_OTHER): Payer: 59 | Admitting: Psychology

## 2017-10-17 DIAGNOSIS — F3181 Bipolar II disorder: Secondary | ICD-10-CM | POA: Diagnosis not present

## 2017-10-22 DIAGNOSIS — F419 Anxiety disorder, unspecified: Secondary | ICD-10-CM | POA: Insufficient documentation

## 2017-11-07 ENCOUNTER — Ambulatory Visit (INDEPENDENT_AMBULATORY_CARE_PROVIDER_SITE_OTHER): Payer: 59 | Admitting: Psychology

## 2017-11-07 DIAGNOSIS — F3181 Bipolar II disorder: Secondary | ICD-10-CM

## 2017-11-21 ENCOUNTER — Ambulatory Visit (INDEPENDENT_AMBULATORY_CARE_PROVIDER_SITE_OTHER): Payer: 59 | Admitting: Psychology

## 2017-11-21 DIAGNOSIS — F3181 Bipolar II disorder: Secondary | ICD-10-CM

## 2017-11-28 ENCOUNTER — Ambulatory Visit: Payer: 59 | Admitting: Psychology

## 2017-12-05 ENCOUNTER — Ambulatory Visit (INDEPENDENT_AMBULATORY_CARE_PROVIDER_SITE_OTHER): Payer: 59 | Admitting: Psychology

## 2017-12-05 DIAGNOSIS — F3181 Bipolar II disorder: Secondary | ICD-10-CM | POA: Diagnosis not present

## 2017-12-19 ENCOUNTER — Ambulatory Visit (INDEPENDENT_AMBULATORY_CARE_PROVIDER_SITE_OTHER): Payer: 59 | Admitting: Psychology

## 2017-12-19 DIAGNOSIS — F3181 Bipolar II disorder: Secondary | ICD-10-CM | POA: Diagnosis not present

## 2018-01-02 ENCOUNTER — Ambulatory Visit (INDEPENDENT_AMBULATORY_CARE_PROVIDER_SITE_OTHER): Payer: 59 | Admitting: Psychology

## 2018-01-02 DIAGNOSIS — F3181 Bipolar II disorder: Secondary | ICD-10-CM

## 2018-01-16 ENCOUNTER — Ambulatory Visit (INDEPENDENT_AMBULATORY_CARE_PROVIDER_SITE_OTHER): Payer: 59 | Admitting: Psychology

## 2018-01-16 DIAGNOSIS — F3181 Bipolar II disorder: Secondary | ICD-10-CM | POA: Diagnosis not present

## 2018-01-30 ENCOUNTER — Ambulatory Visit (INDEPENDENT_AMBULATORY_CARE_PROVIDER_SITE_OTHER): Payer: 59 | Admitting: Psychology

## 2018-01-30 DIAGNOSIS — F3181 Bipolar II disorder: Secondary | ICD-10-CM

## 2018-02-06 ENCOUNTER — Ambulatory Visit: Payer: 59 | Admitting: Psychology

## 2018-02-20 ENCOUNTER — Ambulatory Visit (INDEPENDENT_AMBULATORY_CARE_PROVIDER_SITE_OTHER): Payer: 59 | Admitting: Psychology

## 2018-02-20 DIAGNOSIS — F3181 Bipolar II disorder: Secondary | ICD-10-CM

## 2018-03-06 ENCOUNTER — Ambulatory Visit (INDEPENDENT_AMBULATORY_CARE_PROVIDER_SITE_OTHER): Payer: 59 | Admitting: Psychology

## 2018-03-06 DIAGNOSIS — F3181 Bipolar II disorder: Secondary | ICD-10-CM

## 2018-03-27 ENCOUNTER — Ambulatory Visit (INDEPENDENT_AMBULATORY_CARE_PROVIDER_SITE_OTHER): Payer: 59 | Admitting: Psychology

## 2018-03-27 DIAGNOSIS — F3181 Bipolar II disorder: Secondary | ICD-10-CM | POA: Diagnosis not present

## 2018-04-10 ENCOUNTER — Ambulatory Visit (INDEPENDENT_AMBULATORY_CARE_PROVIDER_SITE_OTHER): Payer: 59 | Admitting: Psychology

## 2018-04-10 DIAGNOSIS — F3181 Bipolar II disorder: Secondary | ICD-10-CM | POA: Diagnosis not present

## 2018-04-24 ENCOUNTER — Ambulatory Visit (INDEPENDENT_AMBULATORY_CARE_PROVIDER_SITE_OTHER): Payer: 59 | Admitting: Psychology

## 2018-04-24 DIAGNOSIS — F3181 Bipolar II disorder: Secondary | ICD-10-CM

## 2018-05-08 ENCOUNTER — Ambulatory Visit (INDEPENDENT_AMBULATORY_CARE_PROVIDER_SITE_OTHER): Payer: 59 | Admitting: Psychology

## 2018-05-08 DIAGNOSIS — F3181 Bipolar II disorder: Secondary | ICD-10-CM

## 2018-05-22 ENCOUNTER — Ambulatory Visit (INDEPENDENT_AMBULATORY_CARE_PROVIDER_SITE_OTHER): Payer: 59 | Admitting: Psychology

## 2018-05-22 DIAGNOSIS — F3181 Bipolar II disorder: Secondary | ICD-10-CM

## 2018-06-05 ENCOUNTER — Ambulatory Visit: Payer: 59 | Admitting: Psychology

## 2018-06-12 ENCOUNTER — Ambulatory Visit: Payer: 59 | Admitting: Psychology

## 2018-06-19 ENCOUNTER — Ambulatory Visit (INDEPENDENT_AMBULATORY_CARE_PROVIDER_SITE_OTHER): Payer: 59 | Admitting: Psychology

## 2018-06-19 DIAGNOSIS — F3181 Bipolar II disorder: Secondary | ICD-10-CM

## 2018-06-23 ENCOUNTER — Encounter (HOSPITAL_COMMUNITY): Payer: Self-pay | Admitting: Emergency Medicine

## 2018-06-23 ENCOUNTER — Other Ambulatory Visit: Payer: Self-pay

## 2018-06-23 DIAGNOSIS — J40 Bronchitis, not specified as acute or chronic: Secondary | ICD-10-CM | POA: Insufficient documentation

## 2018-06-23 DIAGNOSIS — F17210 Nicotine dependence, cigarettes, uncomplicated: Secondary | ICD-10-CM | POA: Diagnosis not present

## 2018-06-23 DIAGNOSIS — R05 Cough: Secondary | ICD-10-CM | POA: Diagnosis present

## 2018-06-23 DIAGNOSIS — Z79899 Other long term (current) drug therapy: Secondary | ICD-10-CM | POA: Insufficient documentation

## 2018-06-23 NOTE — ED Notes (Signed)
Pt called for triage x1 with no answer

## 2018-06-23 NOTE — ED Triage Notes (Addendum)
Pt from home with c/o cough, SOB with exertion, and congestion. Pt states she was diagnosed with bronchitis and sinus infection along with pneumonia recently. Pt states her cough is productive. Pt is able to maintain O2 sat at 93% while talking in complete sentences. Pt is currently on ABX from urgent care. Pt states this has been ongoing since the end of Oct

## 2018-06-24 ENCOUNTER — Emergency Department (HOSPITAL_COMMUNITY)
Admission: EM | Admit: 2018-06-24 | Discharge: 2018-06-24 | Disposition: A | Discharge status: Home / Self Care | Payer: 59 | Attending: Emergency Medicine | Admitting: Emergency Medicine

## 2018-06-24 ENCOUNTER — Emergency Department (HOSPITAL_COMMUNITY): Payer: 59

## 2018-06-24 DIAGNOSIS — J40 Bronchitis, not specified as acute or chronic: Secondary | ICD-10-CM

## 2018-06-24 DIAGNOSIS — R059 Cough, unspecified: Secondary | ICD-10-CM

## 2018-06-24 DIAGNOSIS — R05 Cough: Secondary | ICD-10-CM

## 2018-06-24 NOTE — ED Provider Notes (Signed)
[facility]-EMERGENCY DEPT Provider Note  CSN: [id number] Arrival date & time: 06/23/18 2221  Chief Complaint(s) Cough and Nasal Congestion  HPI Brianna Wilkinson is a 27 y.o. female with a history of anxiety and depression who presents to the emergency department with several months of recurring sinus infections and bronchitis.  Patient reports that she has been evaluated since October for these recurrent symptoms.  States that she is currently on 2 new antibiotics for her sinusitis which she started 4 days ago.  She is endorsing persistent cough productive of yellow sputum.  Denies any recent fevers as of 2 weeks ago.  No nausea or vomiting.  No chest pain.  Endorses mild shortness of breath but reports that it improves with the inhaler that she was given.  No abdominal pain or diarrhea.  No urinary symptoms.  HPI  Past Medical History Past Medical History:  Diagnosis Date  . Anxiety   . Depression   . IBS (irritable bowel syndrome)    Patient Active Problem List   Diagnosis Date Noted  . MDD (major depressive disorder), severe (HCC) 02/21/2016   Home Medication(s) Prior to Admission medications   Medication Sig Start Date End Date Taking? Authorizing Provider  albuterol (PROVENTIL HFA;VENTOLIN HFA) 108 (90 Base) MCG/ACT inhaler Inhale 2 puffs into the lungs every 4 (four) hours as needed for wheezing. 02/24/16   Armandina Stammer I, NP  escitalopram (LEXAPRO) 20 MG tablet Take 1 tablet (20 mg total) by mouth daily. For depression 02/24/16   Armandina Stammer I, NP  hydrOXYzine (ATARAX/VISTARIL) 25 MG tablet Take 1 tablet (25 mg total) by mouth every 6 (six) hours as needed for anxiety. 02/24/16   Armandina Stammer I, NP  nicotine (NICODERM CQ - DOSED IN MG/24 HOURS) 21 mg/24hr patch Place 1 patch (21 mg total) onto the skin daily. For smoking cessation 02/24/16   Armandina Stammer I, NP  traZODone (DESYREL) 50 MG tablet Take 1 tablet (50 mg total) by mouth at bedtime and may repeat dose one  time if needed. 02/24/16   Sanjuana Kava, NP                                                                                                                                    Past Surgical History Past Surgical History:  Procedure Laterality Date  . WISDOM TOOTH EXTRACTION     Family History No family history on file.  Social History Social History   Tobacco Use  . Smoking status: Current Every Day Smoker    Packs/day: 1.00    Years: 4.00    Pack years: 4.00    Types: Cigarettes  . Smokeless tobacco: Never Used  Substance Use Topics  . Alcohol use: No  . Drug use: No   Allergies Other; Pork-derived products; and Penicillins  Review of Systems Review of Systems All other systems are reviewed and are negative for acute change except  as noted in the HPI  Physical Exam Vital Signs  I have reviewed the triage vital signs BP 118/75 (BP Location: Right Arm)   Pulse 75   Temp 98.6 F (37 C) (Oral)   Resp 16   Wt 80.7 kg   LMP 06/11/2018   SpO2 90%   BMI 32.04 kg/m   Physical Exam  Constitutional: She is oriented to person, place, and time. She appears well-developed and well-nourished. No distress.  HENT:  Head: Normocephalic and atraumatic.  Nose: Nose normal. Right sinus exhibits no maxillary sinus tenderness and no frontal sinus tenderness. Left sinus exhibits no maxillary sinus tenderness and no frontal sinus tenderness.  Eyes: Pupils are equal, round, and reactive to light. Conjunctivae and EOM are normal. Right eye exhibits no discharge. Left eye exhibits no discharge. No scleral icterus.  Neck: Normal range of motion. Neck supple.  Cardiovascular: Normal rate and regular rhythm. Exam reveals no gallop and no friction rub.  No murmur heard. Pulmonary/Chest: Effort normal. No stridor. No tachypnea. No respiratory distress. She has rales (course rales throughout).  Abdominal: Soft. She exhibits no distension. There is no tenderness.  Musculoskeletal: She exhibits  no edema or tenderness.  Neurological: She is alert and oriented to person, place, and time.  Skin: Skin is warm and dry. No rash noted. She is not diaphoretic. No erythema.  Psychiatric: She has a normal mood and affect.  Vitals reviewed.   ED Results and Treatments Labs (all labs ordered are listed, but only abnormal results are displayed) Labs Reviewed - No data to display                                                                                                                       EKG  EKG Interpretation  Date/Time:    Ventricular Rate:    PR Interval:    QRS Duration:   QT Interval:    QTC Calculation:   R Axis:     Text Interpretation:        Radiology Dg Chest 2 View  Result Date: 06/24/2018 CLINICAL DATA:  27 year old female with cough. EXAM: CHEST - 2 VIEW COMPARISON:  Chest radiograph dated 04/21/2014 FINDINGS: The heart size and mediastinal contours are within normal limits. Both lungs are clear. The visualized skeletal structures are unremarkable. IMPRESSION: No active cardiopulmonary disease. Electronically Signed   By: Elgie Collard M.D.   On: 06/24/2018 03:24   Pertinent labs & imaging results that were available during my care of the patient were reviewed by me and considered in my medical decision making (see chart for details).  Medications Ordered in ED Medications - No data to display  Procedures Procedures  (including critical care time)  Medical Decision Making / ED Course I have reviewed the nursing notes for this encounter and the patient's prior records (if available in EHR or on provided paperwork).    Patient presents with recurrent sinusitis and cough related to bronchitis.  Lung with coarse rales throughout.  She is currently afebrile with stable vital signs.  No respiratory distress.  Chest x-ray obtained  and negative for evidence of pneumonia, pulmonary edema, pleural effusions.  Recommended continuing her antibiotics.   The patient appears reasonably screened and/or stabilized for discharge and I doubt any other medical condition or other [facility] requiring further screening, evaluation, or treatment in the ED at this time prior to discharge.  The patient is safe for discharge with strict return precautions.   Final Clinical Impression(s) / ED Diagnoses Final diagnoses:  Cough  Bronchitis    Disposition: Discharge  Condition: Good  I have discussed the results, Dx and Tx plan with the patient who expressed understanding and agree(s) with the plan. Discharge instructions discussed at great length. The patient was given strict return precautions who verbalized understanding of the instructions. No further questions at time of discharge.    ED Discharge Orders    None       Follow Up: Primary care provider   If you do not have a primary care physician, contact [facility] at [phone] for referral    This chart was dictated using voice recognition software.  Despite best efforts to proofread,  errors can occur which can change the documentation meaning.   Nira Connardama, Pedro Eduardo, MD 06/24/18 [phone]

## 2018-07-03 ENCOUNTER — Ambulatory Visit (INDEPENDENT_AMBULATORY_CARE_PROVIDER_SITE_OTHER): Payer: 59 | Admitting: Psychology

## 2018-07-03 DIAGNOSIS — F3181 Bipolar II disorder: Secondary | ICD-10-CM

## 2018-07-17 ENCOUNTER — Ambulatory Visit (INDEPENDENT_AMBULATORY_CARE_PROVIDER_SITE_OTHER): Payer: 59 | Admitting: Psychology

## 2018-07-17 DIAGNOSIS — F3181 Bipolar II disorder: Secondary | ICD-10-CM | POA: Diagnosis not present

## 2018-07-24 ENCOUNTER — Ambulatory Visit (INDEPENDENT_AMBULATORY_CARE_PROVIDER_SITE_OTHER): Payer: PRIVATE HEALTH INSURANCE | Admitting: Psychology

## 2018-07-24 DIAGNOSIS — F3181 Bipolar II disorder: Secondary | ICD-10-CM

## 2018-08-02 ENCOUNTER — Encounter: Payer: Self-pay | Admitting: Allergy

## 2018-08-02 ENCOUNTER — Ambulatory Visit: Payer: PRIVATE HEALTH INSURANCE | Admitting: Allergy

## 2018-08-02 VITALS — BP 116/60 | HR 81 | Temp 98.4°F | Resp 16 | Ht 63.0 in | Wt 177.2 lb

## 2018-08-02 DIAGNOSIS — J4540 Moderate persistent asthma, uncomplicated: Secondary | ICD-10-CM | POA: Diagnosis not present

## 2018-08-02 DIAGNOSIS — J3089 Other allergic rhinitis: Secondary | ICD-10-CM | POA: Diagnosis not present

## 2018-08-02 DIAGNOSIS — J988 Other specified respiratory disorders: Secondary | ICD-10-CM

## 2018-08-02 DIAGNOSIS — T781XXD Other adverse food reactions, not elsewhere classified, subsequent encounter: Secondary | ICD-10-CM

## 2018-08-02 DIAGNOSIS — H1013 Acute atopic conjunctivitis, bilateral: Secondary | ICD-10-CM | POA: Diagnosis not present

## 2018-08-02 DIAGNOSIS — L858 Other specified epidermal thickening: Secondary | ICD-10-CM

## 2018-08-02 MED ORDER — OLOPATADINE HCL 0.1 % OP SOLN: 1.0000 [drp] | Freq: Two times a day (BID) | OPHTHALMIC | 5 refills | Status: DC | PRN

## 2018-08-02 MED ORDER — ALBUTEROL SULFATE HFA 108 (90 BASE) MCG/ACT IN AERS: 2.0000 | INHALATION_SPRAY | Freq: Four times a day (QID) | RESPIRATORY_TRACT | 1 refills | Status: DC | PRN

## 2018-08-02 MED ORDER — BUDESONIDE-FORMOTEROL FUMARATE 160-4.5 MCG/ACT IN AERO: 2.0000 | INHALATION_SPRAY | Freq: Two times a day (BID) | RESPIRATORY_TRACT | 5 refills | Status: DC

## 2018-08-02 NOTE — Progress Notes (Signed)
New Patient Note  RE: Brianna Wilkinson MRN: [id number] DOB: [birth year] Date of Office Visit: 08/02/2018  Referring provider: Lindaann Pascal, PA-C Primary care provider: no PCP  Chief Complaint: Current episodes of sinus infections and cough  History of present illness: Brianna Wilkinson is a 28 y.o. female presenting today for evaluation of recurrent sinus infections and asthma.  She has been seen seen in urgent care on multiple occasions including 10/22/2017, 04/05/2018, 05/12/2018, 05/19/2018, 06/17/2018, 06/21/2018, 06/29/2018 all for asthma-like symptoms and sinusitis.  At her most recent urgent care visit a referral was made to see an allergist.  She also was advised to continue on Symbicort 160 mcg and an over-the-counter antihistamine was advised to be added in daily.  She also was given a cough suppressant medication.  For the sinusitis diagnoses she has been treated on multiple occasions with prednisone and azithromycin.  She has also been recommended at 1 of her urgent care visits to use Flonase as well.  She was seen in the ED on 06/24/2018 for "bronchitis".  She had a chest x-ray done that was normal, read as no active cardiopulmonary disease.  Was recommended that she continue on antibiotics that she started course prior to this ED visit.  She does have history of asthma in childhood.  She states with the UC visits as above she reports having SOB, cough, wheezing, chest tightness as well as nasal congestion also with other allergy symptoms including itchy ears and eyes.  She states she sometimes will wake up with "gunk" in her eyes.   She states that the breathing symptoms have improved with lifestyle changes.  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  She states she has been cleaning more at her home including more vacuuming and dusting and will wear a mask.   She does use Symbicort 2 puffs twice a day.  She has been on symbicort since December.  She has  albuterol inhaler that she states she hasn't needed to use since addition of symbicort.     She does report having allergy testing around 2012 and recalls being positive to cockroach and grass pollen and tree pollen.    She does use claritin daily.  She has been off claritin for past 3 days and reports increase in nasal drainage and eye and ear symptoms.  The claritin does help.  She does have nasal spray OTC that she does not use consistently.    She also reports having "eczema".  Problem areas are her upper arms primarily.  She reports using seasalt scrubs to remove deadskin.    She does report certain foods worsen her IBS symptoms with diarrhea including too much green leafy vegatables, pork, grape flavoring, spicy foods.    She doesn't have a PCP at this time but plans to establish with a PCP in Feb 2020.  The referring provided is from the UC she visits.    Review of systems: Review of Systems  Constitutional: Negative for chills, fever and malaise/fatigue.  HENT: Positive for congestion. Negative for ear discharge, nosebleeds, sinus pain and sore throat.   Eyes: Negative for pain, discharge and redness.  Respiratory: Negative for cough, shortness of breath and wheezing.   Cardiovascular: Negative for chest pain.  Gastrointestinal: Negative for abdominal pain, constipation, diarrhea, heartburn, nausea and vomiting.  Musculoskeletal: Negative for joint pain.  Skin: Positive for rash. Negative for itching.  Neurological: Negative for headaches.    All other systems negative  unless noted above in HPI  Past medical history: Past Medical History:  Diagnosis Date  . Angio-edema   . Anxiety   . Asthma   . Depression   . Eczema   . IBS (irritable bowel syndrome)   . Recurrent upper respiratory infection (URI)     Past surgical history: Past Surgical History:  Procedure Laterality Date  . WISDOM TOOTH EXTRACTION      Family history:  Family History  Problem Relation Age of  Onset  . Allergic rhinitis Mother   . Allergic rhinitis Father   . Allergic rhinitis Sister     Social history: Lives in an apartment with carpeting with electric and wood heating and central cooling.  Cat and dog in the home.  No concern for water damage, mildew or roaches in the home.  She is a former smoker and quit recently.  She is a Scientist, research (medical).   Medication List: Allergies as of 08/02/2018      Reactions   Other Diarrhea   Green leafed foods, Spicy foods, Pork , grape food coloring   Pork-derived Products Diarrhea   IBS    Penicillins Other (See Comments)   unknown      Medication List       Accurate as of August 02, 2018  3:29 PM. Always use your most recent med list.        albuterol 108 (90 Base) MCG/ACT inhaler Commonly known as:  PROVENTIL HFA;VENTOLIN HFA Inhale 2 puffs into the lungs every 4 (four) hours as needed for wheezing.   budesonide-formoterol 160-4.5 MCG/ACT inhaler Commonly known as:  SYMBICORT Inhale 2 puffs into the lungs 2 (two) times daily.   dicyclomine 20 MG tablet Commonly known as:  BENTYL Take 20 mg by mouth every 6 (six) hours.   escitalopram 20 MG tablet Commonly known as:  LEXAPRO Take 1 tablet (20 mg total) by mouth daily. For depression   TRI-PREVIFEM 0.18/0.215/0.25 MG-35 MCG tablet Generic drug:  Norgestimate-Ethinyl Estradiol Triphasic Take 1 tablet by mouth daily.       Known medication allergies: Allergies  Allergen Reactions  . Other Diarrhea    Green [employer], Spicy foods, Pork , grape food coloring  . Pork-Derived Products Diarrhea    IBS   . Penicillins Other (See Comments)    unknown     Physical examination: Blood pressure 116/60, pulse 81, temperature 98.4 F (36.9 C), temperature source Oral, resp. rate 16, height 5\' 3"  (1.6 m), weight 177 lb 3.2 oz (80.4 kg), SpO2 95 %.  General: Alert, interactive, in no acute distress. HEENT: PERRLA, TMs pearly gray, turbinates moderately edematous without  discharge, post-pharynx non erythematous. Neck: Supple without lymphadenopathy. Lungs: Clear to auscultation without wheezing, rhonchi or rales. {no increased work of breathing. CV: Normal S1, S2 without murmurs. Abdomen: Nondistended, nontender. Skin: Warm and dry, without lesions or rashes. Extremities:  No clubbing, cyanosis or edema. Neuro:   Grossly intact.  Diagnositics/Labs:  Spirometry: FEV1: 2.86L 91%, FVC: 3.57L 98%, ratio consistent with nonobstructive pattern  Allergy testing: environmental allergy skin prick testing is positive to bahia, marshelder, mugwort, curvularia, mucor plumbeus, dust mites.  Pork skin prick testing is negative Intradermal testing is positive to [place], johnson, grass mix, ragweed mix, cat, cockroach Allergy testing results were read and interpreted by provider, documented by clinical staff.   Assessment and plan:   Recurrent upper respiratory tract infections  -We will obtain an immunocompetent screen at this time including CBC w diff, CMP, immunoglobulins, vaccine  titers.  Allergic rhinitis with conjunctivitis  - environmental allergy skin testing is positive to grass pollens, weed pollens, molds, dust mites, cat and cockroach.   - allergen avoidance measures discussed/handouts provided  - continue Claritin 10mg  daily  - for itchy/watery/red eyes use Pazeo or Pataday 1 drop each eye daily as needed  - for nasal drainage and congestion trial use of Dymista.  Dymista 1 spray each nostril twice a day.  This is a combination nasal spray with Flonase + Astelin (nasal antihistamine).  This helps with both nasal congestion and drainage.   - allergen immunotherapy discussed today including protocol, benefits and risk.  Informational handout provided.  If interested in this therapuetic option you can check with your insurance carrier for coverage.  Let us know if you would like to proceed with this option.    Asthma, mod persistent  - have access to  albuterol inhaler 2 puffs every 4-6 hours as needed for cough/wheeze/shortness of breath/chest tightness.  May use 15-20 minutes prior to activity.   Monitor frequency of use.    - continue Symbicort 160mcg 2 puffs twice a day  Asthma control goals:   Full participation in all desired activities (may need albuterol before activity)  Albuterol use two time or less a week on average (not counting use with activity)  Cough interfering with sleep two time or less a month  Oral steroids no more than once a year  No hospitalizations  Keratosis Pilaris (KP)   - fine bumpy rash on arms and cheeks is KP.  This is a benign skin rash that may be itchy.  Moisturization is key and may use a special lotion containing Lactic Acid like OTC Amlactin or LacHydrin if you would like to smooth out the rash and decrease any itch if present.   Adverse food reaction   - continue avoidance of foods that worsen IBS symptoms  - skin prick to pork (only food item we have available extract for testing today) is negative.    Follow-up 4 months or sooner if needed  I appreciate the opportunity to take part in Jovanka's care. Please do not hesitate to contact me with questions.  Sincerely,   Margo AyeShaylar Basilio Meadow, MD Allergy/Immunology [facility]

## 2018-08-02 NOTE — Patient Instructions (Addendum)
Recurrent upper respiratory tract infections  -We will obtain an immunocompetent screen at this time including CBC w diff, CMP, immunoglobulins, vaccine titers.  Allergies   - environmental allergy skin testing is positive to grass pollens, weed pollens, molds, dust mites, cat and cockroach.   - allergen avoidance measures discussed/handouts provided  - continue Claritin 10mg  daily  - for itchy/watery/red eyes use Pazeo or Pataday 1 drop each eye daily as needed  - for nasal drainage and congestion trial use of Dymista.  Dymista 1 spray each nostril twice a day.  This is a combination nasal spray with Flonase + Astelin (nasal antihistamine).  This helps with both nasal congestion and drainage.   - allergen immunotherapy discussed today including protocol, benefits and risk.  Informational handout provided.  If interested in this therapuetic option you can check with your insurance carrier for coverage.  Let us know if you would like to proceed with this option.    Asthma  - have access to albuterol inhaler 2 puffs every 4-6 hours as needed for cough/wheeze/shortness of breath/chest tightness.  May use 15-20 minutes prior to activity.   Monitor frequency of use.    - continue Symbicort 2 puffs twice a day  Asthma control goals:   Full participation in all desired activities (may need albuterol before activity)  Albuterol use two time or less a week on average (not counting use with activity)  Cough interfering with sleep two time or less a month  Oral steroids no more than once a year  No hospitalizations  Keratosis Pilaris (KP)   - fine bumpy rash on arms and cheeks is KP.  This is a benign skin rash that may be itchy.  Moisturization is key and may use a special lotion containing Lactic Acid like OTC Amlactin or LacHydrin if you would like to smooth out the rash and decrease any itch if present.   Adverse food reaction   - continue avoidance of foods that worsen IBS symptoms  - skin prick to pork (only food item we have available extract for testing today) is negative.    Follow-up 4 months or sooner if needed

## 2018-08-07 ENCOUNTER — Ambulatory Visit (INDEPENDENT_AMBULATORY_CARE_PROVIDER_SITE_OTHER): Payer: PRIVATE HEALTH INSURANCE | Admitting: Psychology

## 2018-08-07 DIAGNOSIS — F3181 Bipolar II disorder: Secondary | ICD-10-CM | POA: Diagnosis not present

## 2018-08-15 LAB — IGG, IGA, IGM
IGA/IMMUNOGLOBULIN A, SERUM: 475 mg/dL — AB (ref 87–352)
IgG (Immunoglobin G), Serum: 723 mg/dL (ref 700–1600)
IgM (Immunoglobulin M), Srm: 68 mg/dL (ref 26–217)

## 2018-08-15 LAB — CBC WITH DIFFERENTIAL
Basophils Absolute: 0.1 10*3/uL (ref 0.0–0.2)
Basos: 1 %
EOS (ABSOLUTE): 0.1 10*3/uL (ref 0.0–0.4)
Eos: 2 %
HEMOGLOBIN: 14.3 g/dL (ref 11.1–15.9)
Hematocrit: 41.6 % (ref 34.0–46.6)
Immature Grans (Abs): 0 10*3/uL (ref 0.0–0.1)
Immature Granulocytes: 0 %
LYMPHS ABS: 1.9 10*3/uL (ref 0.7–3.1)
Lymphs: 31 %
MCH: 31 pg (ref 26.6–33.0)
MCHC: 34.4 g/dL (ref 31.5–35.7)
MCV: 90 fL (ref 79–97)
MONOS ABS: 0.4 10*3/uL (ref 0.1–0.9)
Monocytes: 7 %
NEUTROS PCT: 59 %
Neutrophils Absolute: 3.7 10*3/uL (ref 1.4–7.0)
RBC: 4.61 x10E6/uL (ref 3.77–5.28)
RDW: 12.6 % (ref 11.7–15.4)
WBC: 6.2 10*3/uL (ref 3.4–10.8)

## 2018-08-15 LAB — STREP PNEUMONIAE 23 SEROTYPES IGG
PNEUMO AB TYPE 1: 0.6 ug/mL — AB (ref >1.3)
PNEUMO AB TYPE 2: 0.2 ug/mL — AB (ref >1.3)
PNEUMO AB TYPE 5: 0.1 ug/mL — AB (ref >1.3)
PNEUMO AB TYPE 8: 0.5 ug/mL — AB (ref >1.3)
Pneumo Ab Type 12 (12F)*: 0.6 ug/mL — ABNORMAL LOW (ref >1.3)
Pneumo Ab Type 14*: 1.4 ug/mL (ref >1.3)
Pneumo Ab Type 17 (17F)*: <0.1 ug/mL — ABNORMAL LOW (ref >1.3)
Pneumo Ab Type 19 (19F)*: 6 ug/mL (ref >1.3)
Pneumo Ab Type 20*: 0.1 ug/mL — ABNORMAL LOW (ref >1.3)
Pneumo Ab Type 22 (22F)*: 0.3 ug/mL — ABNORMAL LOW (ref >1.3)
Pneumo Ab Type 23 (23F)*: <0.1 ug/mL — ABNORMAL LOW (ref >1.3)
Pneumo Ab Type 26 (6B)*: 0.1 ug/mL — ABNORMAL LOW (ref >1.3)
Pneumo Ab Type 3*: 51.1 ug/mL (ref >1.3)
Pneumo Ab Type 34 (10A)*: <0.1 ug/mL — ABNORMAL LOW (ref >1.3)
Pneumo Ab Type 4*: <0.1 ug/mL — ABNORMAL LOW (ref >1.3)
Pneumo Ab Type 43 (11A)*: 0.2 ug/mL — ABNORMAL LOW (ref >1.3)
Pneumo Ab Type 51 (7F)*: 0.2 ug/mL — ABNORMAL LOW (ref >1.3)
Pneumo Ab Type 54 (15B)*: 0.3 ug/mL — ABNORMAL LOW (ref >1.3)
Pneumo Ab Type 56 (18C)*: 0.1 ug/mL — ABNORMAL LOW (ref >1.3)
Pneumo Ab Type 57 (19A)*: 26.3 ug/mL (ref >1.3)
Pneumo Ab Type 68 (9V)*: <0.1 ug/mL — ABNORMAL LOW (ref >1.3)
Pneumo Ab Type 70 (33F)*: 0.4 ug/mL — ABNORMAL LOW (ref >1.3)
Pneumo Ab Type 9 (9N)*: 0.1 ug/mL — ABNORMAL LOW (ref >1.3)

## 2018-08-15 LAB — COMPREHENSIVE METABOLIC PANEL
ALT: 19 IU/L (ref 0–32)
AST: 16 IU/L (ref 0–40)
Albumin/Globulin Ratio: 1.8 (ref 1.2–2.2)
Albumin: 4.1 g/dL (ref 3.5–5.5)
Alkaline Phosphatase: 65 IU/L (ref 39–117)
BUN/Creatinine Ratio: 11 (ref 9–23)
BUN: 8 mg/dL (ref 6–20)
Bilirubin Total: 0.5 mg/dL (ref 0.0–1.2)
CHLORIDE: 105 mmol/L (ref 96–106)
CO2: 20 mmol/L (ref 20–29)
CREATININE: 0.74 mg/dL (ref 0.57–1.00)
Calcium: 9 mg/dL (ref 8.7–10.2)
GFR calc Af Amer: 129 mL/min/{1.73_m2} (ref >59)
GFR calc non Af Amer: 112 mL/min/{1.73_m2} (ref >59)
GLUCOSE: 78 mg/dL (ref 65–99)
Globulin, Total: 2.3 g/dL (ref 1.5–4.5)
Potassium: 4.3 mmol/L (ref 3.5–5.2)
Sodium: 140 mmol/L (ref 134–144)
Total Protein: 6.4 g/dL (ref 6.0–8.5)

## 2018-08-15 LAB — DIPHTHERIA / TETANUS ANTIBODY PANEL
Diphtheria Ab: 0.4 IU/mL (ref <0.10)
Tetanus Ab, IgG: 0.93 IU/mL (ref <0.10)

## 2018-08-15 LAB — IGE: IGE (IMMUNOGLOBULIN E), SERUM: 38 [IU]/mL (ref 6–495)

## 2018-08-21 ENCOUNTER — Ambulatory Visit (INDEPENDENT_AMBULATORY_CARE_PROVIDER_SITE_OTHER): Payer: PRIVATE HEALTH INSURANCE | Admitting: Psychology

## 2018-08-21 DIAGNOSIS — F3181 Bipolar II disorder: Secondary | ICD-10-CM

## 2018-08-29 ENCOUNTER — Ambulatory Visit (INDEPENDENT_AMBULATORY_CARE_PROVIDER_SITE_OTHER): Payer: PRIVATE HEALTH INSURANCE | Admitting: Psychology

## 2018-08-29 DIAGNOSIS — F3181 Bipolar II disorder: Secondary | ICD-10-CM | POA: Diagnosis not present

## 2018-09-04 ENCOUNTER — Ambulatory Visit (INDEPENDENT_AMBULATORY_CARE_PROVIDER_SITE_OTHER): Payer: PRIVATE HEALTH INSURANCE | Admitting: Psychology

## 2018-09-04 DIAGNOSIS — F3181 Bipolar II disorder: Secondary | ICD-10-CM

## 2018-09-15 ENCOUNTER — Encounter: Payer: Self-pay | Admitting: Family Medicine

## 2018-09-15 ENCOUNTER — Ambulatory Visit (INDEPENDENT_AMBULATORY_CARE_PROVIDER_SITE_OTHER): Payer: PRIVATE HEALTH INSURANCE | Admitting: Family Medicine

## 2018-09-15 VITALS — BP 120/62 | HR 67 | Temp 98.7°F | Ht 63.0 in | Wt 174.0 lb

## 2018-09-15 DIAGNOSIS — Z23 Encounter for immunization: Secondary | ICD-10-CM

## 2018-09-15 DIAGNOSIS — J4540 Moderate persistent asthma, uncomplicated: Secondary | ICD-10-CM | POA: Diagnosis not present

## 2018-09-15 DIAGNOSIS — R5383 Other fatigue: Secondary | ICD-10-CM

## 2018-09-15 DIAGNOSIS — Z13220 Encounter for screening for lipoid disorders: Secondary | ICD-10-CM | POA: Diagnosis not present

## 2018-09-15 DIAGNOSIS — K589 Irritable bowel syndrome without diarrhea: Secondary | ICD-10-CM

## 2018-09-15 DIAGNOSIS — Z114 Encounter for screening for human immunodeficiency virus [HIV]: Secondary | ICD-10-CM | POA: Diagnosis not present

## 2018-09-15 DIAGNOSIS — R5381 Other malaise: Secondary | ICD-10-CM

## 2018-09-15 DIAGNOSIS — R635 Abnormal weight gain: Secondary | ICD-10-CM | POA: Diagnosis not present

## 2018-09-15 DIAGNOSIS — F339 Major depressive disorder, recurrent, unspecified: Secondary | ICD-10-CM

## 2018-09-15 DIAGNOSIS — F902 Attention-deficit hyperactivity disorder, combined type: Secondary | ICD-10-CM

## 2018-09-15 DIAGNOSIS — J309 Allergic rhinitis, unspecified: Secondary | ICD-10-CM

## 2018-09-15 DIAGNOSIS — B009 Herpesviral infection, unspecified: Secondary | ICD-10-CM

## 2018-09-15 DIAGNOSIS — E559 Vitamin D deficiency, unspecified: Secondary | ICD-10-CM

## 2018-09-15 DIAGNOSIS — Z30016 Encounter for initial prescription of transdermal patch hormonal contraceptive device: Secondary | ICD-10-CM

## 2018-09-15 DIAGNOSIS — F5101 Primary insomnia: Secondary | ICD-10-CM

## 2018-09-15 LAB — CBC WITH DIFFERENTIAL/PLATELET
Basophils Absolute: 0.1 10*3/uL (ref 0.0–0.1)
Basophils Relative: 1.1 % (ref 0.0–3.0)
Eosinophils Absolute: 0.2 10*3/uL (ref 0.0–0.7)
Eosinophils Relative: 2.5 % (ref 0.0–5.0)
HCT: 45.2 % (ref 36.0–46.0)
Hemoglobin: 15.3 g/dL — ABNORMAL HIGH (ref 12.0–15.0)
Lymphocytes Relative: 25.8 % (ref 12.0–46.0)
Lymphs Abs: 1.8 10*3/uL (ref 0.7–4.0)
MCHC: 33.8 g/dL (ref 30.0–36.0)
MCV: 91.7 fl (ref 78.0–100.0)
Monocytes Absolute: 0.6 10*3/uL (ref 0.1–1.0)
Monocytes Relative: 7.9 % (ref 3.0–12.0)
Neutro Abs: 4.4 10*3/uL (ref 1.4–7.7)
Neutrophils Relative %: 62.7 % (ref 43.0–77.0)
Platelets: 265 10*3/uL (ref 150.0–400.0)
RBC: 4.93 Mil/uL (ref 3.87–5.11)
RDW: 13.1 % (ref 11.5–15.5)
WBC: 7.1 10*3/uL (ref 4.0–10.5)

## 2018-09-15 LAB — LIPID PANEL
Cholesterol: 155 mg/dL (ref 0–200)
HDL: 42.8 mg/dL (ref >39.00)
LDL Cholesterol: 96 mg/dL (ref 0–99)
NonHDL: 112.09
Total CHOL/HDL Ratio: 4
Triglycerides: 82 mg/dL (ref 0.0–149.0)
VLDL: 16.4 mg/dL (ref 0.0–40.0)

## 2018-09-15 LAB — COMPREHENSIVE METABOLIC PANEL
ALT: 23 U/L (ref 0–35)
AST: 15 U/L (ref 0–37)
Albumin: 4.4 g/dL (ref 3.5–5.2)
Alkaline Phosphatase: 76 U/L (ref 39–117)
BUN: 12 mg/dL (ref 6–23)
CO2: 25 mEq/L (ref 19–32)
Calcium: 9.4 mg/dL (ref 8.4–10.5)
Chloride: 106 mEq/L (ref 96–112)
Creatinine, Ser: 0.74 mg/dL (ref 0.40–1.20)
GFR: 94.74 mL/min (ref >60.00)
Glucose, Bld: 83 mg/dL (ref 70–99)
Potassium: 4.2 mEq/L (ref 3.5–5.1)
Sodium: 140 mEq/L (ref 135–145)
Total Bilirubin: 0.7 mg/dL (ref 0.2–1.2)
Total Protein: 6.9 g/dL (ref 6.0–8.3)

## 2018-09-15 LAB — HEMOGLOBIN A1C: Hgb A1c MFr Bld: 4.9 % (ref 4.6–6.5)

## 2018-09-15 LAB — VITAMIN D 25 HYDROXY (VIT D DEFICIENCY, FRACTURES): VITD: 18.92 ng/mL — ABNORMAL LOW (ref 30.00–100.00)

## 2018-09-15 LAB — TSH: TSH: 2.1 u[IU]/mL (ref 0.35–4.50)

## 2018-09-15 LAB — VITAMIN B12: Vitamin B-12: 165 pg/mL — ABNORMAL LOW (ref 211–911)

## 2018-09-15 MED ORDER — NORELGESTROMIN-ETH ESTRADIOL 150-35 MCG/24HR TD PTWK: 1.0000 | MEDICATED_PATCH | TRANSDERMAL | 12 refills | Status: DC

## 2018-09-15 MED ORDER — ESCITALOPRAM OXALATE 20 MG PO TABS: 20.0000 mg | ORAL_TABLET | Freq: Every day | ORAL | 0 refills | Status: DC

## 2018-09-15 MED ORDER — TRAZODONE HCL 50 MG PO TABS: 50.0000 mg | ORAL_TABLET | Freq: Every day | ORAL | 1 refills | Status: DC

## 2018-09-15 MED ORDER — AMPHETAMINE-DEXTROAMPHET ER 20 MG PO CP24: 20.0000 mg | ORAL_CAPSULE | ORAL | 0 refills | Status: DC

## 2018-09-15 MED ORDER — DICYCLOMINE HCL 20 MG PO TABS: 20.0000 mg | ORAL_TABLET | Freq: Four times a day (QID) | ORAL | 1 refills | Status: DC

## 2018-09-15 MED ORDER — VALACYCLOVIR HCL 1 G PO TABS: 1000.0000 mg | ORAL_TABLET | Freq: Two times a day (BID) | ORAL | 1 refills | Status: DC

## 2018-09-16 ENCOUNTER — Encounter: Payer: Self-pay | Admitting: Family Medicine

## 2018-09-16 DIAGNOSIS — J309 Allergic rhinitis, unspecified: Secondary | ICD-10-CM | POA: Insufficient documentation

## 2018-09-16 DIAGNOSIS — F5101 Primary insomnia: Secondary | ICD-10-CM | POA: Insufficient documentation

## 2018-09-16 DIAGNOSIS — F902 Attention-deficit hyperactivity disorder, combined type: Secondary | ICD-10-CM | POA: Insufficient documentation

## 2018-09-16 DIAGNOSIS — F339 Major depressive disorder, recurrent, unspecified: Secondary | ICD-10-CM | POA: Insufficient documentation

## 2018-09-16 DIAGNOSIS — J4540 Moderate persistent asthma, uncomplicated: Secondary | ICD-10-CM | POA: Insufficient documentation

## 2018-09-16 DIAGNOSIS — K589 Irritable bowel syndrome without diarrhea: Secondary | ICD-10-CM | POA: Insufficient documentation

## 2018-09-16 DIAGNOSIS — E559 Vitamin D deficiency, unspecified: Secondary | ICD-10-CM | POA: Insufficient documentation

## 2018-09-16 LAB — HIV ANTIBODY (ROUTINE TESTING W REFLEX): HIV 1&2 Ab, 4th Generation: NONREACTIVE

## 2018-09-16 NOTE — Progress Notes (Signed)
Brianna Wilkinson is a 28 y.o. female is here to [facility].   Patient Care Team: [name], DO as PCP - General (Family Medicine) Kennith Gain, MD as Consulting Physician (Allergy) Oren Binet, PhD as Consulting Physician (Psychology)   History of Present Illness:   HPI: Patient is an incredibly nice 28 year old woman presenting for new patient exam.  She was previously followed by Brianna Robinson, MD at [facility].  She was treated for ADHD with Adderall there and complains that she was needing to go when each month and had a urine drug screen at every single visit.  This was too expensive for her.  She is a Theatre manager.  She has an associates degree.  She is also followed by Dr. Cheryln Wilkinson.  She met him in 2017.  She attempted suicide via wrist laceration.  She was admitted to behavioral health and stayed for a little bit less than a week.  She feels that therapy with him has been very helpful.  She has a very strong family history of mental health disorder.  Her mother died by suicide when the patient was a child.  The patient did find her mother.  She states that her sister and brother also have severe depression.  No one in her family has been diagnosed with bipolar disorder or other mental health diagnoses.  Patient is in a stable supportive relationship with her boyfriend.  No children.  She admits to not focusing on healthy diet or exercise.  She does smoke marijuana occasionally.  History of severe allergies recently and was evaluated by allergist.  Those notes are are reviewed today.  Also with history of IBS.  Today, the patient does require several medication refills.  She has been off of some of her medications for several days.  This has caused some mood swings for her and increased anger issues.  She states that her birth control has not been as helpful for her as the previous patch.  She asks to be changed back to the patch.  She denies any issues  with asthma at this time.  Health Maintenance Due  Topic Date Due  . HIV Screening  07/18/2007  . PAP-Cervical Cytology Screening  07/17/2013  . PAP SMEAR-Modifier  07/17/2013   No flowsheet data found.  PMHx, SurgHx, SocialHx, Medications, and Allergies were reviewed in the Visit Navigator and updated as appropriate.   Past Medical History:  Diagnosis Date  . Angio-edema   . Anxiety   . Asthma   . Depression   . Eczema   . IBS (irritable bowel syndrome)      Past Surgical History:  Procedure Laterality Date  . WISDOM TOOTH EXTRACTION       Family History  Problem Relation Age of Onset  . Allergic rhinitis Mother   . Depression Mother   . Early death Mother   . Mental illness Mother   . Miscarriages / [place] Mother   . Allergic rhinitis Father   . Depression Father   . Heart disease Father   . Hypertension Father   . Mental illness Father   . Allergic rhinitis Sister   . Mental illness Sister   . Depression Maternal Grandmother   . Early death Maternal Grandfather   . Heart attack Maternal Grandfather   . Hypertension Maternal Grandfather   . Heart disease Maternal Grandfather   . Early death Paternal Grandfather   . Heart attack Paternal Grandfather   . Hypertension Paternal Grandfather   .  Allergic rhinitis Sister   . Mental illness Sister     Social History   Tobacco Use  . Smoking status: Former Smoker    Packs/day: 1.00    Years: 4.00    Pack years: 4.00    Types: Cigarettes  . Smokeless tobacco: Never Used  Substance Use Topics  . Alcohol use: Yes    Comment: occasional  . Drug use: Yes    Types: Marijuana    Comment: ocassional    Current Medications and Allergies   .  albuterol (PROVENTIL HFA;VENTOLIN HFA) 108 (90 Base) MCG/ACT inhaler, Inhale 2 puffs into the lungs every 4 (four) hours as needed for wheezing., Disp: 1 Inhaler, Rfl: 0 .  Azelastine-Fluticasone (DYMISTA) 137-50 MCG/ACT SUSP, Place into the nose., Disp: , Rfl:  .   budesonide-formoterol (SYMBICORT) 160-4.5 MCG/ACT inhaler, Inhale 2 puffs into the lungs 2 (two) times daily., Disp: , Rfl:  .  dicyclomine (BENTYL) 20 MG tablet, Take 1 tablet (20 mg total) by mouth every 6 (six) hours., Disp: 90 tablet, Rfl: 1 .  escitalopram (LEXAPRO) 20 MG tablet, Take 1 tablet (20 mg total) by mouth daily. For depression, Disp: 30 tablet, Rfl: 0 .  loratadine (CLARITIN) 10 MG tablet, Take 10 mg by mouth daily., Disp: , Rfl:  .  Norgestimate-Ethinyl Estradiol Triphasic (TRI-PREVIFEM) 0.18/0.215/0.25 MG-35 MCG tablet, Take 1 tablet by mouth daily., Disp: , Rfl:  .  traZODone (DESYREL) 50 MG tablet, Take 1 tablet (50 mg total) by mouth at bedtime., Disp: 90 tablet, Rfl: 1 .  valACYclovir (VALTREX) 1000 MG tablet, Take 1 tablet (1,000 mg total) by mouth 2 (two) times daily., Disp: 30 tablet, Rfl: 1 .  amphetamine-dextroamphetamine (ADDERALL XR) 20 MG 24 hr capsule, Take 1 capsule (20 mg total) by mouth every morning., Disp: 30 capsule, Rfl: 0 .  norelgestromin-ethinyl estradiol (ORTHO EVRA) 150-35 MCG/24HR transdermal patch, Place 1 patch onto the skin once a week., Disp: 3 patch, Rfl: 12   Allergies  Allergen Reactions  . Other Diarrhea    Green [employer], Spicy foods, Pork , grape food coloring  . Pork-Derived Products Diarrhea    IBS   . Penicillins Other (See Comments)    unknown   Review of Systems   Pertinent items are noted in the HPI. Otherwise, a complete ROS is negative.  Vitals   Vitals:   09/15/18 1129  BP: 120/62  Pulse: 67  Temp: 98.7 F (37.1 C)  TempSrc: Oral  SpO2: 98%  Weight: 174 lb (78.9 kg)  Height: _0  (1.6 m)     Body mass index is 30.82 kg/m.  Physical Exam   Physical Exam Vitals signs and nursing note reviewed.  Constitutional:      General: She is not in acute distress.    Appearance: She is well-developed.  HENT:     Head: Normocephalic and atraumatic.     Right Ear: External ear normal.     Left Ear: External ear  normal.     Nose: Nose normal.  Eyes:     Conjunctiva/sclera: Conjunctivae normal.     Pupils: Pupils are equal, round, and reactive to light.  Neck:     Musculoskeletal: Normal range of motion and neck supple.     Thyroid: No thyromegaly.  Cardiovascular:     Rate and Rhythm: Normal rate and regular rhythm.     Heart sounds: Normal heart sounds.  Pulmonary:     Effort: Pulmonary effort is normal.     Breath  sounds: Normal breath sounds.  Abdominal:     General: Bowel sounds are normal.     Palpations: Abdomen is soft.  Musculoskeletal: Normal range of motion.  Lymphadenopathy:     Cervical: No cervical adenopathy.  Skin:    General: Skin is warm and dry.     Capillary Refill: Capillary refill takes less than 2 seconds.  Neurological:     Mental Status: She is alert and oriented to person, place, and time.  Psychiatric:        Behavior: Behavior normal.     Assessment and Plan   Madhuri was seen today for establish care.  Diagnoses and all orders for this visit:  Encounter for screening for HIV  Vitamin D deficiency -     VITAMIN D 25 Hydroxy (Vit-D Deficiency, Fractures)  Need for immunization against influenza -     Flu Vaccine QUAD 36+ mos IM  Moderate persistent asthma without complication  Allergic rhinitis, unspecified seasonality, unspecified trigger  Screening for lipid disorders -     Lipid panel  Malaise and fatigue -     CBC with Differential/Platelet -     Comprehensive metabolic panel -     TSH -     Vitamin B12  Screening for HIV (human immunodeficiency virus) -     HIV Antibody (routine testing w rflx)  Irritable bowel syndrome, unspecified type -     dicyclomine (BENTYL) 20 MG tablet; Take 1 tablet (20 mg total) by mouth every 6 (six) hours.  Depression, recurrent ([place]) -     escitalopram (LEXAPRO) 20 MG tablet; Take 1 tablet (20 mg total) by mouth daily. For depression  Primary insomnia -     traZODone (DESYREL) 50 MG tablet; Take 1  tablet (50 mg total) by mouth at bedtime.  Encounter for initial prescription of transdermal patch hormonal contraceptive device -     norelgestromin-ethinyl estradiol (ORTHO EVRA) 150-35 MCG/24HR transdermal patch; Place 1 patch onto the skin once a week.  Attention deficit hyperactivity disorder (ADHD), combined type -     amphetamine-dextroamphetamine (ADDERALL XR) 20 MG 24 hr capsule; Take 1 capsule (20 mg total) by mouth every morning.  Weight gain -     Hemoglobin A1c  Herpes -     valACYclovir (VALTREX) 1000 MG tablet; Take 1 tablet (1,000 mg total) by mouth 2 (two) times daily.    . Orders and follow up as documented in [place], reviewed diet, exercise and weight control, cardiovascular risk and specific lipid/LDL goals reviewed, reviewed medications and side effects in detail.  . Reviewed expectations re: course of current medical issues. . Outlined signs and symptoms indicating need for more acute intervention. . Patient verbalized understanding and all questions were answered. . Patient received an After Visit Summary.  [name], DO [place] [place] [facility] 09/16/2018

## 2018-09-18 ENCOUNTER — Ambulatory Visit (INDEPENDENT_AMBULATORY_CARE_PROVIDER_SITE_OTHER): Payer: PRIVATE HEALTH INSURANCE | Admitting: Psychology

## 2018-09-18 ENCOUNTER — Telehealth: Payer: Self-pay | Admitting: Family Medicine

## 2018-09-18 DIAGNOSIS — F3181 Bipolar II disorder: Secondary | ICD-10-CM | POA: Diagnosis not present

## 2018-09-18 NOTE — Telephone Encounter (Signed)
Patient returning call for lab results. NOD/ Nurse Triage currently unavailable. Patient would like a call after 4:30pm today, if possible. Will be in another appointment until then.   Copied from CRM [id number]. Topic: Quick Communication - Lab Results (Clinic Use ONLY) >> Sep 18, 2018  2:41 PM Donnamarie Poag, CMA wrote: Called patient to inform them of 09/18/2018 lab results. When patient returns call, triage nurse may disclose results.

## 2018-09-18 NOTE — Telephone Encounter (Signed)
Charted in result notes. 

## 2018-09-22 ENCOUNTER — Other Ambulatory Visit: Payer: Self-pay | Admitting: Family Medicine

## 2018-09-22 DIAGNOSIS — B009 Herpesviral infection, unspecified: Secondary | ICD-10-CM

## 2018-10-02 ENCOUNTER — Ambulatory Visit (INDEPENDENT_AMBULATORY_CARE_PROVIDER_SITE_OTHER): Payer: PRIVATE HEALTH INSURANCE | Admitting: Psychology

## 2018-10-02 DIAGNOSIS — F3181 Bipolar II disorder: Secondary | ICD-10-CM | POA: Diagnosis not present

## 2018-10-07 ENCOUNTER — Other Ambulatory Visit: Payer: Self-pay | Admitting: Family Medicine

## 2018-10-07 DIAGNOSIS — F339 Major depressive disorder, recurrent, unspecified: Secondary | ICD-10-CM

## 2018-10-10 ENCOUNTER — Other Ambulatory Visit: Payer: Self-pay | Admitting: Family Medicine

## 2018-10-10 DIAGNOSIS — B009 Herpesviral infection, unspecified: Secondary | ICD-10-CM

## 2018-10-11 ENCOUNTER — Other Ambulatory Visit: Payer: Self-pay

## 2018-10-11 DIAGNOSIS — B009 Herpesviral infection, unspecified: Secondary | ICD-10-CM

## 2018-10-11 MED ORDER — VALACYCLOVIR HCL 1 G PO TABS: 1000.0000 mg | ORAL_TABLET | Freq: Two times a day (BID) | ORAL | 1 refills | Status: AC

## 2018-10-16 ENCOUNTER — Ambulatory Visit (INDEPENDENT_AMBULATORY_CARE_PROVIDER_SITE_OTHER): Payer: PRIVATE HEALTH INSURANCE | Admitting: Psychology

## 2018-10-16 DIAGNOSIS — F3181 Bipolar II disorder: Secondary | ICD-10-CM | POA: Diagnosis not present

## 2018-10-30 ENCOUNTER — Ambulatory Visit (INDEPENDENT_AMBULATORY_CARE_PROVIDER_SITE_OTHER): Payer: PRIVATE HEALTH INSURANCE | Admitting: Psychology

## 2018-10-30 DIAGNOSIS — F3181 Bipolar II disorder: Secondary | ICD-10-CM | POA: Diagnosis not present

## 2018-11-13 ENCOUNTER — Ambulatory Visit: Payer: PRIVATE HEALTH INSURANCE | Admitting: Psychology

## 2018-11-14 ENCOUNTER — Ambulatory Visit (INDEPENDENT_AMBULATORY_CARE_PROVIDER_SITE_OTHER): Payer: PRIVATE HEALTH INSURANCE | Admitting: Psychology

## 2018-11-14 DIAGNOSIS — F3181 Bipolar II disorder: Secondary | ICD-10-CM

## 2018-11-27 ENCOUNTER — Ambulatory Visit (INDEPENDENT_AMBULATORY_CARE_PROVIDER_SITE_OTHER): Payer: PRIVATE HEALTH INSURANCE | Admitting: Psychology

## 2018-11-27 DIAGNOSIS — F3181 Bipolar II disorder: Secondary | ICD-10-CM | POA: Diagnosis not present

## 2018-12-01 ENCOUNTER — Ambulatory Visit (INDEPENDENT_AMBULATORY_CARE_PROVIDER_SITE_OTHER): Payer: PRIVATE HEALTH INSURANCE | Admitting: Allergy

## 2018-12-01 ENCOUNTER — Other Ambulatory Visit: Payer: Self-pay

## 2018-12-01 ENCOUNTER — Encounter: Payer: Self-pay | Admitting: Allergy

## 2018-12-01 DIAGNOSIS — J3089 Other allergic rhinitis: Secondary | ICD-10-CM | POA: Diagnosis not present

## 2018-12-01 DIAGNOSIS — J302 Other seasonal allergic rhinitis: Secondary | ICD-10-CM | POA: Diagnosis not present

## 2018-12-01 DIAGNOSIS — J4540 Moderate persistent asthma, uncomplicated: Secondary | ICD-10-CM

## 2018-12-01 DIAGNOSIS — H1013 Acute atopic conjunctivitis, bilateral: Secondary | ICD-10-CM | POA: Diagnosis not present

## 2018-12-01 DIAGNOSIS — L858 Other specified epidermal thickening: Secondary | ICD-10-CM | POA: Insufficient documentation

## 2018-12-01 NOTE — Patient Instructions (Addendum)
Moderate persistent asthma Continue Symbicort 160-2 puffs twice a day to prevent cough and wheeze Continue albuterol inhaler 2 puffs every 4 hours as needed for cough or wheeze Use albuterol 2 puffs about 5-15 minutes before exercise to decrease shortness of breath, cough or wheeze  Seasonal and perennial allergic rhinitis Continue Claritin 10 mg once a day as needed for a runny nose Continue Dymista 1 spray in each nostril twice a day as needed for nasal symptoms  Allergic conjunctivitis of both eyes Pazeo eye drop one drop in each eye once a day as needed for red, itchy, or watery eyes  Keratosis pilaris Continue your daily moisturizing routine Continue using a lotion containing Lactic Acid like OTC Amlactin or LacHydrin if you would like to smooth out the rash and decrease any itch if present.   Call the clinic if this treatment plan is not working well for you  Follow up in 6 months or sooner if needed

## 2018-12-01 NOTE — Progress Notes (Signed)
RE: Brianna Wilkinson MRN: [id number] DOB: 06-Jun-1991 Date of Telemedicine Visit: 12/01/2018  Referring provider: Lindaann PascalLong, Scott, PA-C Primary care provider: Helane RimaWallace, Erica, DO  Chief Complaint: Allergies   Telemedicine Follow Up Visit via Telephone: I connected with Brianna Wilkinson for a follow up on 12/01/18 by telephone and verified that I am speaking with the correct person using two identifiers.   I discussed the limitations, risks, security and privacy concerns of performing an evaluation and management service by telephone and the availability of in person appointments. I also discussed with the patient that there may be a patient responsible charge related to this service. The patient expressed understanding and agreed to proceed.  Patient is at home  Provider is at the office.  Visit start time: 2:24 Visit end time: 2:46 Insurance consent/check in by: Doreen BeamJennifer Tweedy Medical consent and medical assistant/nurse: Cristela FeltHeather Vernon  History of Present Illness: She is a 28 y.o. female, who is being followed for asthma, allergic rhinitis, allergic conjunctivitis, and keratosis pilaris. Her previous allergy office visit was on 08/02/2018 with Dr. Delorse LekPadgett. At today's visit, she reports her asthma has been well controlled with occasional shortness of breath with vigorous exercise. She denies shortness of breath at rest. She denies wheeze and cough with activity and rest. She continues Symbicort 160-2 puffs twice a day and has not needed her albuterol inhaler over the last 4 months since her last visit to this office. She reports allergic rhinitis as well controlled with occasional nasal congestion for which she uses Dymista nasal spray and Claritin as needed. She is not using nasal saline rinses at this time. She reports her left eye begins to water when she goes outside when the pollen count is high. She is not currently using any eye drops as she reports the watering is manageable and she is  "bad at eyedrops". Keratosis pilaris is reported as well controlled at this time with no medical intervention. Her current medications are listed in the chart.  Assessment and Plan: Patient Instructions  Moderate persistent asthma Continue Symbicort 160-2 puffs twice a day to prevent cough and wheeze Continue albuterol inhaler 2 puffs every 4 hours as needed for cough or wheeze Use albuterol 2 puffs about 5-15 minutes before exercise to decrease shortness of breath, cough or wheeze  Seasonal and perennial allergic rhinitis Continue Claritin 10 mg once a day as needed for a runny nose Continue Dymista 1 spray in each nostril twice a day as needed for nasal symptoms  Allergic conjunctivitis of both eyes Pazeo eye drop one drop in each eye once a day as needed for red, itchy, or watery eyes  Keratosis pilaris Continue your daily moisturizing routine Continue using a lotion containing Lactic Acid like OTC Amlactin or LacHydrin if you would like to smooth out the rash and decrease any itch if present.   Call the clinic if this treatment plan is not working well for you  Follow up in 6 months or sooner if needed    Return in about 6 months (around 06/03/2019), or if symptoms worsen or fail to improve.  No orders of the defined types were placed in this encounter.   Medication List:  Current Outpatient Medications  Medication Sig Dispense Refill   albuterol (PROVENTIL HFA;VENTOLIN HFA) 108 (90 Base) MCG/ACT inhaler Inhale 2 puffs into the lungs every 4 (four) hours as needed for wheezing. 1 Inhaler 0   amphetamine-dextroamphetamine (ADDERALL XR) 20 MG 24 hr capsule Take 1 capsule (20 mg total) by  mouth every morning. 30 capsule 0   Azelastine-Fluticasone (DYMISTA) 137-50 MCG/ACT SUSP Place into the nose.     budesonide-formoterol (SYMBICORT) 160-4.5 MCG/ACT inhaler Inhale 2 puffs into the lungs 2 (two) times daily.     dicyclomine (BENTYL) 20 MG tablet Take 1 tablet (20 mg total)  by mouth every 6 (six) hours. 90 tablet 1   escitalopram (LEXAPRO) 20 MG tablet TAKE 1 TABLET (20 MG TOTAL) BY MOUTH DAILY FOR DEPRESSION 90 tablet 1   loratadine (CLARITIN) 10 MG tablet Take 10 mg by mouth daily.     norelgestromin-ethinyl estradiol (ORTHO EVRA) 150-35 MCG/24HR transdermal patch Place 1 patch onto the skin once a week. 3 patch 12   traZODone (DESYREL) 50 MG tablet Take 1 tablet (50 mg total) by mouth at bedtime. 90 tablet 1   valACYclovir (VALTREX) 1000 MG tablet Take 1 tablet (1,000 mg total) by mouth 2 (two) times daily. 180 tablet 1   No current facility-administered medications for this visit.    Allergies: Allergies  Allergen Reactions   Other Diarrhea    Green leafed foods, Spicy foods, Pork , grape food coloring   Pork-Derived Products Diarrhea    IBS    Penicillins Other (See Comments)    unknown   I reviewed her past medical history, social history, family history, and environmental history and no significant changes have been reported from previous visit on 08/02/2018.  Objective: Physical Exam Not obtained as encounter was done via telephone.   Previous notes and tests were reviewed.  I discussed the assessment and treatment plan with the patient. The patient was provided an opportunity to ask questions and all were answered. The patient agreed with the plan and demonstrated an understanding of the instructions.   The patient was advised to call back or seek an in-person evaluation if the symptoms worsen or if the condition fails to improve as anticipated.  I provided 22 minutes of non-face-to-face time during this encounter.  It was my pleasure to participate in Brianna Wilkinson care today. Please feel free to contact me with any questions or concerns.   Sincerely,  Thermon Leyland, FNP

## 2018-12-14 ENCOUNTER — Ambulatory Visit (INDEPENDENT_AMBULATORY_CARE_PROVIDER_SITE_OTHER): Payer: PRIVATE HEALTH INSURANCE | Admitting: Family Medicine

## 2018-12-14 ENCOUNTER — Other Ambulatory Visit: Payer: Self-pay

## 2018-12-14 ENCOUNTER — Encounter: Payer: Self-pay | Admitting: Family Medicine

## 2018-12-14 ENCOUNTER — Ambulatory Visit (INDEPENDENT_AMBULATORY_CARE_PROVIDER_SITE_OTHER): Payer: PRIVATE HEALTH INSURANCE | Admitting: Psychology

## 2018-12-14 DIAGNOSIS — F339 Major depressive disorder, recurrent, unspecified: Secondary | ICD-10-CM | POA: Diagnosis not present

## 2018-12-14 DIAGNOSIS — F902 Attention-deficit hyperactivity disorder, combined type: Secondary | ICD-10-CM | POA: Diagnosis not present

## 2018-12-14 DIAGNOSIS — F5101 Primary insomnia: Secondary | ICD-10-CM

## 2018-12-14 DIAGNOSIS — F3181 Bipolar II disorder: Secondary | ICD-10-CM

## 2018-12-14 MED ORDER — AMPHETAMINE-DEXTROAMPHET ER 20 MG PO CP24: 20.0000 mg | ORAL_CAPSULE | Freq: Every day | ORAL | 0 refills | Status: DC

## 2018-12-14 MED ORDER — ESCITALOPRAM OXALATE 10 MG PO TABS: 10.0000 mg | ORAL_TABLET | Freq: Every day | ORAL | 2 refills | Status: DC

## 2018-12-14 NOTE — Progress Notes (Signed)
Virtual Visit via Video   Due to the COVID-19 pandemic, this visit was completed with telemedicine (audio/video) technology to reduce patient and provider exposure as well as to preserve personal protective equipment.   I connected with [name] by a video enabled telemedicine application and verified that I am speaking with the correct person using two identifiers. Location patient: Home Location provider: [facility], Office Persons participating in the virtual visit: Doristine Liceaga, Helane Rima, DO   I discussed the limitations of evaluation and management by telemedicine and the availability of in person appointments. The patient expressed understanding and agreed to proceed.  Care Team   Patient Care Team: Helane Rima, DO as PCP - General (Family Medicine) Marcelyn Bruins, MD as Consulting Physician (Allergy) Haze Rushing, PhD as Consulting Physician (Psychology) Maye Hides, PA as Consulting Physician (Physician Assistant)  Subjective:   HPI:   1. Depression, recurrent (HCC). Doing well. Likes the Lexapro 10 mg daily dose best. No side effects.    2. Primary insomnia. Sleeping well on current treatment.    3. Attention deficit hyperactivity disorder (ADHD), combined type.  Since the last visit has the patient had any:  Appetite changes? No Unintentional weight loss? No Is medication working well ? Yes Does patient take drug holidays? No Difficulties falling to sleep or maintaining sleep? No Any anxiety?  No Any cardiac issues (fainting or paliptations)? No Suicidal thoughts? No Changes in health since last visit? No New medications? No Any illicit substance abuse? No Has the patient taken his medication today? No   Review of Systems  Constitutional: Negative for chills, fever, malaise/fatigue and weight loss.  Respiratory: Negative for cough, shortness of breath and wheezing.   Cardiovascular: Negative for chest pain,  palpitations and leg swelling.  Gastrointestinal: Negative for abdominal pain, constipation, diarrhea, nausea and vomiting.  Genitourinary: Negative for dysuria and urgency.  Musculoskeletal: Negative for joint pain and myalgias.  Skin: Negative for rash.  Neurological: Negative for dizziness and headaches.  Psychiatric/Behavioral: Negative for depression, substance abuse and suicidal ideas. The patient is not nervous/anxious.      Patient Active Problem List   Diagnosis Date Noted  . Seasonal and perennial allergic rhinitis 12/01/2018  . Allergic conjunctivitis of both eyes 12/01/2018  . Keratosis pilaris 12/01/2018  . Vitamin D deficiency 09/16/2018  . Moderate persistent asthma, uncomplicated 09/16/2018  . Allergic rhinitis 09/16/2018  . Irritable bowel syndrome 09/16/2018  . Depression, recurrent (HCC) 09/16/2018  . Primary insomnia 09/16/2018  . Attention deficit hyperactivity disorder (ADHD), combined type 09/16/2018    Social History   Tobacco Use  . Smoking status: Former Smoker    Packs/day: 1.00    Years: 4.00    Pack years: 4.00    Types: Cigarettes  . Smokeless tobacco: Never Used  Substance Use Topics  . Alcohol use: Yes    Comment: occasional    Current Outpatient Medications:  .  albuterol (PROVENTIL HFA;VENTOLIN HFA) 108 (90 Base) MCG/ACT inhaler, Inhale 2 puffs into the lungs every 4 (four) hours as needed for wheezing., Disp: 1 Inhaler, Rfl: 0 .  amphetamine-dextroamphetamine (ADDERALL XR) 20 MG 24 hr capsule, Take 1 capsule (20 mg total) by mouth every morning., Disp: 30 capsule, Rfl: 0 .  Azelastine-Fluticasone (DYMISTA) 137-50 MCG/ACT SUSP, Place into the nose., Disp: , Rfl:  .  budesonide-formoterol (SYMBICORT) 160-4.5 MCG/ACT inhaler, Inhale 2 puffs into the lungs 2 (two) times daily., Disp: , Rfl:  .  dicyclomine (BENTYL) 20 MG tablet,  Take 1 tablet (20 mg total) by mouth every 6 (six) hours., Disp: 90 tablet, Rfl: 1 .  loratadine (CLARITIN) 10 MG  tablet, Take 10 mg by mouth daily., Disp: , Rfl:  .  norelgestromin-ethinyl estradiol (ORTHO EVRA) 150-35 MCG/24HR transdermal patch, Place 1 patch onto the skin once a week., Disp: 3 patch, Rfl: 12 .  traZODone (DESYREL) 50 MG tablet, Take 1 tablet (50 mg total) by mouth at bedtime., Disp: 90 tablet, Rfl: 1 .  valACYclovir (VALTREX) 1000 MG tablet, Take 1 tablet (1,000 mg total) by mouth 2 (two) times daily., Disp: 180 tablet, Rfl: 1 .  amphetamine-dextroamphetamine (ADDERALL XR) 20 MG 24 hr capsule, Take 1 capsule (20 mg total) by mouth daily., Disp: 30 capsule, Rfl: 0 .  escitalopram (LEXAPRO) 10 MG tablet, Take 1 tablet (10 mg total) by mouth daily., Disp: 30 tablet, Rfl: 2  Allergies  Allergen Reactions  . Other Diarrhea    Green Nucor Corporationleafed foods, Spicy foods, Pork , grape food coloring  . Pork-Derived Products Diarrhea    IBS   . Penicillins Other (See Comments)    unknown    Objective:   VITALS: Per patient if applicable, see vitals. GENERAL: Alert, appears well and in no acute distress. HEENT: Atraumatic, conjunctiva clear, no obvious abnormalities on inspection of external nose and ears. NECK: Normal movements of the head and neck. CARDIOPULMONARY: No increased WOB. Speaking in clear sentences. I:E ratio WNL.  MS: Moves all visible extremities without noticeable abnormality. PSYCH: Pleasant and cooperative, well-groomed. Speech normal rate and rhythm. Affect is appropriate. Insight and judgement are appropriate. Attention is focused, linear, and appropriate.  NEURO: CN grossly intact. Oriented as arrived to appointment on time with no prompting. Moves both UE equally.  SKIN: No obvious lesions, wounds, erythema, or cyanosis noted on face or hands.  Assessment and Plan:   Brianna Wilkinson was seen today for follow-up.  Diagnoses and all orders for this visit:  Depression, recurrent (HCC) -     escitalopram (LEXAPRO) 10 MG tablet; Take 1 tablet (10 mg total) by mouth daily.  Primary  insomnia  Attention deficit hyperactivity disorder (ADHD), combined type -     amphetamine-dextroamphetamine (ADDERALL XR) 20 MG 24 hr capsule; Take 1 capsule (20 mg total) by mouth daily.    Marland Kitchen. COVID-19 Education: The signs and symptoms of COVID-19 were discussed with the patient and how to seek care for testing if needed. The importance of social distancing was discussed today. . Reviewed expectations re: course of current medical issues. . Discussed self-management of symptoms. . Outlined signs and symptoms indicating need for more acute intervention. . Patient verbalized understanding and all questions were answered. Marland Kitchen. Health Maintenance issues including appropriate healthy diet, exercise, and smoking avoidance were discussed with patient. . See orders for this visit as documented in the electronic medical record.  Helane RimaErica Jediah Horger, DO  Records requested if needed. Time spent: 25 minutes, of which >50% was spent in obtaining information about her symptoms, reviewing her previous labs, evaluations, and treatments, counseling her about her condition (please see the discussed topics above), and developing a plan to further investigate it; she had a number of questions which I addressed.

## 2018-12-15 ENCOUNTER — Encounter: Payer: Self-pay | Admitting: Family Medicine

## 2018-12-15 ENCOUNTER — Ambulatory Visit: Payer: PRIVATE HEALTH INSURANCE | Admitting: Family Medicine

## 2018-12-20 ENCOUNTER — Ambulatory Visit (INDEPENDENT_AMBULATORY_CARE_PROVIDER_SITE_OTHER): Payer: PRIVATE HEALTH INSURANCE | Admitting: Physician Assistant

## 2018-12-20 ENCOUNTER — Other Ambulatory Visit: Payer: Self-pay

## 2018-12-20 ENCOUNTER — Telehealth: Payer: Self-pay

## 2018-12-20 ENCOUNTER — Encounter: Payer: Self-pay | Admitting: Physician Assistant

## 2018-12-20 DIAGNOSIS — Z20822 Contact with and (suspected) exposure to covid-19: Secondary | ICD-10-CM

## 2018-12-20 DIAGNOSIS — R6889 Other general symptoms and signs: Secondary | ICD-10-CM | POA: Diagnosis not present

## 2018-12-20 DIAGNOSIS — Z7189 Other specified counseling: Secondary | ICD-10-CM

## 2018-12-20 NOTE — Telephone Encounter (Signed)
Pt needs testing for Covid-19.

## 2018-12-20 NOTE — Progress Notes (Signed)
Virtual Visit via Video   I connected with Brianna Wilkinson on 12/20/18 at  8:00 AM EDT by a video enabled telemedicine application and verified that I am speaking with the correct person using two identifiers. Location patient: Home Location provider: [facility], Office Persons participating in the virtual visit: Floyd, Parilla PA-C  I discussed the limitations of evaluation and management by telemedicine and the availability of in person appointments. The patient expressed understanding and agreed to proceed.  Subjective:   HPI:   Patient reports sudden onset of diarrhea, cough, chills, sore throat, ear pain starting yesterday.  She works at a [employer] and recently went back to work this week.  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  When they checked her temperature yesterday at work it was normal.  She denies significant shortness of breath, chest pain, lower extremity swelling, inability to stay hydrated.  She is eating small frequent meals.  She denies any abdominal pain or known contacts with COVID-19.  She wears a mask while she is at work.  She does have a history of asthma.  ROS: See pertinent positives and negatives per HPI.  Patient Active Problem List   Diagnosis Date Noted  . Seasonal and perennial allergic rhinitis 12/01/2018  . Allergic conjunctivitis of both eyes 12/01/2018  . Keratosis pilaris 12/01/2018  . Vitamin D deficiency 09/16/2018  . Moderate persistent asthma, uncomplicated 09/16/2018  . Allergic rhinitis 09/16/2018  . Irritable bowel syndrome 09/16/2018  . Depression, recurrent (HCC) 09/16/2018  . Primary insomnia 09/16/2018  . Attention deficit hyperactivity disorder (ADHD), combined type 09/16/2018    Social History   Tobacco Use  . Smoking status: Former Smoker    Packs/day: 1.00    Years: 4.00    Pack years: 4.00    Types: Cigarettes  . Smokeless tobacco: Never Used  Substance Use  Topics  . Alcohol use: Yes    Comment: occasional    Current Outpatient Medications:  .  albuterol (PROVENTIL HFA;VENTOLIN HFA) 108 (90 Base) MCG/ACT inhaler, Inhale 2 puffs into the lungs every 4 (four) hours as needed for wheezing., Disp: 1 Inhaler, Rfl: 0 .  amphetamine-dextroamphetamine (ADDERALL XR) 20 MG 24 hr capsule, Take 1 capsule (20 mg total) by mouth every morning., Disp: 30 capsule, Rfl: 0 .  amphetamine-dextroamphetamine (ADDERALL XR) 20 MG 24 hr capsule, Take 1 capsule (20 mg total) by mouth daily., Disp: 30 capsule, Rfl: 0 .  Azelastine-Fluticasone (DYMISTA) 137-50 MCG/ACT SUSP, Place into the nose., Disp: , Rfl:  .  budesonide-formoterol (SYMBICORT) 160-4.5 MCG/ACT inhaler, Inhale 2 puffs into the lungs 2 (two) times daily., Disp: , Rfl:  .  dicyclomine (BENTYL) 20 MG tablet, Take 1 tablet (20 mg total) by mouth every 6 (six) hours., Disp: 90 tablet, Rfl: 1 .  escitalopram (LEXAPRO) 10 MG tablet, Take 1 tablet (10 mg total) by mouth daily., Disp: 30 tablet, Rfl: 2 .  loratadine (CLARITIN) 10 MG tablet, Take 10 mg by mouth daily., Disp: , Rfl:  .  norelgestromin-ethinyl estradiol (ORTHO EVRA) 150-35 MCG/24HR transdermal patch, Place 1 patch onto the skin once a week., Disp: 3 patch, Rfl: 12 .  traZODone (DESYREL) 50 MG tablet, Take 1 tablet (50 mg total) by mouth at bedtime., Disp: 90 tablet, Rfl: 1 .  valACYclovir (VALTREX) 1000 MG tablet, Take 1 tablet (1,000 mg total) by mouth 2 (two) times daily., Disp: 180 tablet, Rfl: 1  Allergies  Allergen Reactions  . Other  Diarrhea    Green Nucor [employer]leafed foods, Spicy foods, Pork , grape food coloring  . Pork-Derived Products Diarrhea    IBS   . Penicillins Other (See Comments)    unknown    Objective:   VITALS: Per patient if applicable, see vitals. GENERAL: Alert, appears well and in no acute distress. HEENT: Atraumatic, conjunctiva clear, no obvious abnormalities on inspection of external nose and ears. NECK: Normal movements of  the head and neck. CARDIOPULMONARY: No increased WOB. Speaking in clear sentences. I:E ratio WNL.  MS: Moves all visible extremities without noticeable abnormality. PSYCH: Pleasant and cooperative, well-groomed. Speech normal rate and rhythm. Affect is appropriate. Insight and judgement are appropriate. Attention is focused, linear, and appropriate.  NEURO: CN grossly intact. Oriented as arrived to appointment on time with no prompting. Moves both UE equally.  SKIN: No obvious lesions, wounds, erythema, or cyanosis noted on face or hands.  Assessment and Plan:   Diagnoses and all orders for this visit:  Advice Given About Covid-19 Virus Infection; Suspected Covid-19 Virus Infection Suspect patient has likely COVID-19.  Discussed testing options and she is agreeable [facility]  I will put an order for [facility]  I reviewed the following precautions:  "Given the current COVID-19 outbreak, it is my professional recommendation that for your symptoms you should self-isolate for at least 7 days from when your symptoms started. Please do not return to work until you are fever free for at least 3 days without medications and significant improvement of your symptoms.  If you develop severe shortness of breath, uncontrolled fevers, coughing up blood, confusion, chest pain, or signs of dehydration (such as significantly decreased urine amounts or dizziness with standing) please CALL the ER and then GO to the ER."  . Reviewed expectations re: course of current medical issues. . Discussed self-management of symptoms. . Outlined signs and symptoms indicating need for more acute intervention. . Patient verbalized understanding and all questions were answered. Marland Kitchen. Health Maintenance issues including appropriate healthy diet, exercise, and smoking avoidance were discussed with patient. . See orders for this visit as documented in the  electronic medical record.  I discussed the assessment and treatment plan with the patient. The patient was provided an opportunity to ask questions and all were answered. The patient agreed with the plan and demonstrated an understanding of the instructions.   The patient was advised to call back or seek an in-person evaluation if the symptoms worsen or if the condition fails to improve as anticipated.   Shoal Creek DriveSamantha Thea Holshouser, GeorgiaPA 12/20/2018

## 2018-12-20 NOTE — Telephone Encounter (Signed)
Contacted pt to arrange COVID testing; pt offered and accepted appointment at [facility] site 12/20/2018 at 0900; pt given address, directions, and instructions that she and all occupants of a vehicle should wear a mask; she verbalized understanding; orders placed per protocol; will route to provider for notification.

## 2018-12-20 NOTE — Addendum Note (Signed)
Addended by: Redmond Baseman on: 12/20/2018 08:41 AM   Modules accepted: Orders

## 2018-12-22 ENCOUNTER — Ambulatory Visit (INDEPENDENT_AMBULATORY_CARE_PROVIDER_SITE_OTHER): Payer: PRIVATE HEALTH INSURANCE | Admitting: Physician Assistant

## 2018-12-22 ENCOUNTER — Encounter: Payer: Self-pay | Admitting: Physician Assistant

## 2018-12-22 VITALS — Ht 63.0 in | Wt 170.0 lb

## 2018-12-22 DIAGNOSIS — R059 Cough, unspecified: Secondary | ICD-10-CM

## 2018-12-22 DIAGNOSIS — R05 Cough: Secondary | ICD-10-CM | POA: Diagnosis not present

## 2018-12-22 LAB — SPECIMEN STATUS REPORT

## 2018-12-22 LAB — NOVEL CORONAVIRUS, NAA: SARS-CoV-2, NAA: NOT DETECTED

## 2018-12-22 MED ORDER — AZITHROMYCIN 250 MG PO TABS: ORAL_TABLET | ORAL | 0 refills | Status: DC

## 2018-12-22 NOTE — Progress Notes (Signed)
Virtual Visit via Video   I connected with Brianna Wilkinson on 12/22/18 at  2:40 PM EDT by a video enabled telemedicine application and verified that I am speaking with the correct person using two identifiers. Location patient: Home Location provider: [place] [facility], Office Persons participating in the virtual visit: Brianna Wilkinson, Brianna Wilkinson, Brianna Wilkinson  I discussed the limitations of evaluation and management by telemedicine and the availability of in person appointments. The patient expressed understanding and agreed to proceed.  I, Brianna Wilkinson, acting as a Brianna Wilkinson for Brianna [employer], PA.,have documented all relevant documentation on the behalf of Brianna Motto, PA,as directed by  Brianna Motto, PA while in the presence of Brianna Motto, [place].   Subjective:   HPI:   Cough Last seen by me on 12/20/2018. Tested negative for Covid-19. Sx are not worsening but also not improving. Endorses continued nasal congestion, cough, SOB with exertion. She is having subjective fever and chills. Does not have thermometer.  Denies increased SOB/wheezing, diarrhea. She has some SOB with exertion. She used Albuterol HFA once yesterday. Heat seems to make sx worse.   Has hx PNA. Staying well hydrated and able to eat small meals.   ROS: See pertinent positives and negatives per HPI.  Patient Active Problem List   Diagnosis Date Noted  . Seasonal and perennial allergic rhinitis 12/01/2018  . Allergic conjunctivitis of both eyes 12/01/2018  . Keratosis pilaris 12/01/2018  . Vitamin D deficiency 09/16/2018  . Moderate persistent asthma, uncomplicated 09/16/2018  . Allergic rhinitis 09/16/2018  . Irritable bowel syndrome 09/16/2018  . Depression, recurrent (HCC) 09/16/2018  . Primary insomnia 09/16/2018  . Attention deficit hyperactivity disorder (ADHD), combined type 09/16/2018    Social History   Tobacco Use  . Smoking status: Former Smoker    Packs/day: 1.00   Years: 4.00    Pack years: 4.00    Types: Cigarettes  . Smokeless tobacco: Never Used  Substance Use Topics  . Alcohol use: Yes    Comment: occasional    Current Outpatient Medications:  .  albuterol (PROVENTIL HFA;VENTOLIN HFA) 108 (90 Base) MCG/ACT inhaler, Inhale 2 puffs into the lungs every 4 (four) hours as needed for wheezing., Disp: 1 Inhaler, Rfl: 0 .  amphetamine-dextroamphetamine (ADDERALL XR) 20 MG 24 hr capsule, Take 1 capsule (20 mg total) by mouth every morning., Disp: 30 capsule, Rfl: 0 .  amphetamine-dextroamphetamine (ADDERALL XR) 20 MG 24 hr capsule, Take 1 capsule (20 mg total) by mouth daily., Disp: 30 capsule, Rfl: 0 .  Azelastine-Fluticasone (DYMISTA) 137-50 MCG/ACT SUSP, Place into the nose., Disp: , Rfl:  .  budesonide-formoterol (SYMBICORT) 160-4.5 MCG/ACT inhaler, Inhale 2 puffs into the lungs 2 (two) times daily., Disp: , Rfl:  .  dicyclomine (BENTYL) 20 MG tablet, Take 1 tablet (20 mg total) by mouth every 6 (six) hours., Disp: 90 tablet, Rfl: 1 .  escitalopram (LEXAPRO) 10 MG tablet, Take 1 tablet (10 mg total) by mouth daily., Disp: 30 tablet, Rfl: 2 .  loratadine (CLARITIN) 10 MG tablet, Take 10 mg by mouth daily., Disp: , Rfl:  .  norelgestromin-ethinyl estradiol (ORTHO EVRA) 150-35 MCG/24HR transdermal patch, Place 1 patch onto the skin once a week., Disp: 3 patch, Rfl: 12 .  traZODone (DESYREL) 50 MG tablet, Take 1 tablet (50 mg total) by mouth at bedtime., Disp: 90 tablet, Rfl: 1 .  valACYclovir (VALTREX) 1000 MG tablet, Take 1 tablet (1,000 mg total) by mouth 2 (two) times daily., Disp: 180 tablet,  Rfl: 1  Allergies  Allergen Reactions  . Other Diarrhea    Brianna Wilkinson, Spicy Wilkinson, Pork , grape food coloring  . Pork-Derived Products Diarrhea    IBS   . Penicillins Other (See Comments)    unknown    Objective:   VITALS: Per patient if applicable, see vitals. GENERAL: Alert, appears well and in no acute distress. HEENT: Atraumatic,  conjunctiva clear, no obvious abnormalities on inspection of external nose and ears. NECK: Normal movements of the head and neck. CARDIOPULMONARY: No increased WOB. Speaking in clear sentences. I:E ratio WNL.  MS: Moves all visible extremities without noticeable abnormality. PSYCH: Pleasant and cooperative, well-groomed. Speech normal rate and rhythm. Affect is appropriate. Insight and judgement are appropriate. Attention is focused, linear, and appropriate.  NEURO: CN grossly intact. Oriented as arrived to appointment on time with no prompting. Moves both UE equally.  SKIN: No obvious lesions, wounds, erythema, or cyanosis noted on face or hands.  Assessment and Plan:   Brianna Wilkinson was seen today for headache and cough.  Diagnoses and all orders for this visit:  Cough  COVID-19 test was negative. I recommended continued self-quarantine given symptoms, subjective fever. Will treat for possible bronchitis/sinusitis with azithromycin. Follow-up in UC or ER over weekend if symptoms worsen.  . Reviewed expectations re: course of current medical issues. . Discussed self-management of symptoms. . Outlined signs and symptoms indicating need for more acute intervention. . Patient verbalized understanding and all questions were answered. Marland Kitchen. Health Maintenance issues including appropriate healthy diet, exercise, and smoking avoidance were discussed with patient. . See orders for this visit as documented in the electronic medical record.  I discussed the assessment and treatment plan with the patient. The patient was provided an opportunity to ask questions and all were answered. The patient agreed with the plan and demonstrated an understanding of the instructions.   The patient was advised to call back or seek an in-person evaluation if the symptoms worsen or if the condition fails to improve as anticipated.   Wilkinson or LPN served as scribe during this visit. History, Physical, and Plan performed by  medical provider. The above documentation has been reviewed and is accurate and complete.  Brianna Wilkinson, Brianna Wilkinson 12/22/2018

## 2018-12-24 ENCOUNTER — Encounter: Payer: Self-pay | Admitting: Physician Assistant

## 2018-12-25 ENCOUNTER — Ambulatory Visit (INDEPENDENT_AMBULATORY_CARE_PROVIDER_SITE_OTHER): Payer: PRIVATE HEALTH INSURANCE | Admitting: Psychology

## 2018-12-25 DIAGNOSIS — F331 Major depressive disorder, recurrent, moderate: Secondary | ICD-10-CM | POA: Diagnosis not present

## 2018-12-29 ENCOUNTER — Encounter: Payer: Self-pay | Admitting: Family Medicine

## 2019-01-08 ENCOUNTER — Ambulatory Visit: Payer: PRIVATE HEALTH INSURANCE | Admitting: Psychology

## 2019-01-09 ENCOUNTER — Other Ambulatory Visit: Payer: Self-pay | Admitting: Family Medicine

## 2019-01-09 DIAGNOSIS — F339 Major depressive disorder, recurrent, unspecified: Secondary | ICD-10-CM

## 2019-01-10 ENCOUNTER — Ambulatory Visit (INDEPENDENT_AMBULATORY_CARE_PROVIDER_SITE_OTHER): Payer: PRIVATE HEALTH INSURANCE | Admitting: Psychology

## 2019-01-10 DIAGNOSIS — F331 Major depressive disorder, recurrent, moderate: Secondary | ICD-10-CM | POA: Diagnosis not present

## 2019-01-10 NOTE — Telephone Encounter (Signed)
Rx request Last fill 12/14/18  #30/2 Last OV 12/14/18 Next OV 01/23/19

## 2019-01-16 ENCOUNTER — Telehealth: Payer: Self-pay | Admitting: Family Medicine

## 2019-01-16 NOTE — Telephone Encounter (Signed)
Called pt and rescheduled appt. Pt needs a call back to discuss meds. Please advise.   Copied from [place] [phone]. Topic: Appointment Scheduling - Scheduling Inquiry for Clinic >> Jan 16, 2019 12:46 PM Valla Leaver wrote: Reason for CRM: Patient needs to r/s her 07/07 appt. Also would like a call back from [place] to discuss her medications because she is really tired while taking adderral and her depression medication.

## 2019-01-17 NOTE — Telephone Encounter (Signed)
Patient scheduled for a virtual visit January 29, 2019.

## 2019-01-17 NOTE — Telephone Encounter (Signed)
These issues require a visit. Okay to schedule virtual if problem visit needed. Unfortunately, not appropriate for me to address via phone call (non-appointment).

## 2019-01-22 ENCOUNTER — Ambulatory Visit (INDEPENDENT_AMBULATORY_CARE_PROVIDER_SITE_OTHER): Payer: PRIVATE HEALTH INSURANCE | Admitting: Psychology

## 2019-01-22 DIAGNOSIS — F331 Major depressive disorder, recurrent, moderate: Secondary | ICD-10-CM | POA: Diagnosis not present

## 2019-01-23 ENCOUNTER — Encounter: Payer: PRIVATE HEALTH INSURANCE | Admitting: Family Medicine

## 2019-01-26 NOTE — Addendum Note (Signed)
Addended by: Bonney Leitz T on: 01/26/2019 03:15 PM   Modules accepted: Orders

## 2019-01-29 ENCOUNTER — Ambulatory Visit: Payer: PRIVATE HEALTH INSURANCE | Admitting: Family Medicine

## 2019-01-29 NOTE — Progress Notes (Deleted)
Virtual Visit via Video   Due to the COVID-19 pandemic, this visit was completed with telemedicine (audio/video) technology to reduce patient and provider exposure as well as to preserve personal protective equipment.   I connected with Brianna Wilkinson by a video enabled telemedicine application and verified that I am speaking with the correct person using two identifiers. Location patient: Home Location provider: [facility], Office Persons participating in the virtual visit: [name], [name], DO   I discussed the limitations of evaluation and management by telemedicine and the availability of in person appointments. The patient expressed understanding and agreed to proceed.  Care Team   Patient Care Team: [name], DO as PCP - General (Family Medicine) Kennith Gain, MD as Consulting Physician (Allergy) Oren Binet, PhD as Consulting Physician (Psychology) Penni Bombard, PA as Consulting Physician (Physician Assistant)  Subjective:   HPI:   ROS   Patient Active Problem List   Diagnosis Date Noted  . Seasonal and perennial allergic rhinitis 12/01/2018  . Allergic conjunctivitis of both eyes 12/01/2018  . Keratosis pilaris 12/01/2018  . Vitamin D deficiency 09/16/2018  . Moderate persistent asthma, uncomplicated 37/[id number]/2409  . Allergic rhinitis 09/16/2018  . Irritable bowel syndrome 09/16/2018  . Depression, recurrent ([place]) 09/16/2018  . Primary insomnia 09/16/2018  . Attention deficit hyperactivity disorder (ADHD), combined type 09/16/2018    Social History   Tobacco Use  . Smoking status: Former Smoker    Packs/day: 1.00    Years: 4.00    Pack years: 4.00    Types: Cigarettes  . Smokeless tobacco: Never Used  Substance Use Topics  . Alcohol use: Yes    Comment: occasional    Current Outpatient Medications:  .  albuterol (PROVENTIL HFA;VENTOLIN HFA) 108 (90 Base) MCG/ACT inhaler, Inhale 2 puffs into the lungs  every 4 (four) hours as needed for wheezing., Disp: 1 Inhaler, Rfl: 0 .  amphetamine-dextroamphetamine (ADDERALL XR) 20 MG 24 hr capsule, Take 1 capsule (20 mg total) by mouth every morning., Disp: 30 capsule, Rfl: 0 .  amphetamine-dextroamphetamine (ADDERALL XR) 20 MG 24 hr capsule, Take 1 capsule (20 mg total) by mouth daily., Disp: 30 capsule, Rfl: 0 .  Azelastine-Fluticasone (DYMISTA) 137-50 MCG/ACT SUSP, Place into the nose., Disp: , Rfl:  .  azithromycin (ZITHROMAX) 250 MG tablet, Take two tablets on day 1, then one tablet daily x 4, Disp: 6 each, Rfl: 0 .  budesonide-formoterol (SYMBICORT) 160-4.5 MCG/ACT inhaler, Inhale 2 puffs into the lungs 2 (two) times daily., Disp: , Rfl:  .  dicyclomine (BENTYL) 20 MG tablet, Take 1 tablet (20 mg total) by mouth every 6 (six) hours., Disp: 90 tablet, Rfl: 1 .  escitalopram (LEXAPRO) 10 MG tablet, TAKE 1 TABLET BY MOUTH EVERY DAY, Disp: 90 tablet, Rfl: 1 .  loratadine (CLARITIN) 10 MG tablet, Take 10 mg by mouth daily., Disp: , Rfl:  .  norelgestromin-ethinyl estradiol (ORTHO EVRA) 150-35 MCG/24HR transdermal patch, Place 1 patch onto the skin once a week., Disp: 3 patch, Rfl: 12 .  traZODone (DESYREL) 50 MG tablet, Take 1 tablet (50 mg total) by mouth at bedtime., Disp: 90 tablet, Rfl: 1 .  valACYclovir (VALTREX) 1000 MG tablet, Take 1 tablet (1,000 mg total) by mouth 2 (two) times daily., Disp: 180 tablet, Rfl: 1  Allergies  Allergen Reactions  . Other Diarrhea    [employer], Spicy foods, Pork , grape food coloring  . Pork-Derived Products Diarrhea    IBS   .  Penicillins Other (See Comments)    unknown    Objective:   VITALS: Per patient if applicable, see vitals. GENERAL: Alert, appears well and in no acute distress. HEENT: Atraumatic, conjunctiva clear, no obvious abnormalities on inspection of external nose and ears. NECK: Normal movements of the head and neck. CARDIOPULMONARY: No increased WOB. Speaking in clear sentences. I:E  ratio WNL.  MS: Moves all visible extremities without noticeable abnormality. PSYCH: Pleasant and cooperative, well-groomed. Speech normal rate and rhythm. Affect is appropriate. Insight and judgement are appropriate. Attention is focused, linear, and appropriate.  NEURO: CN grossly intact. Oriented as arrived to appointment on time with no prompting. Moves both UE equally.  SKIN: No obvious lesions, wounds, erythema, or cyanosis noted on face or hands.  No flowsheet data found.  Assessment and Plan:   There are no diagnoses linked to this encounter.  Marland Kitchen. COVID-19 Education: The signs and symptoms of COVID-19 were discussed with the patient and how to seek care for testing if needed. The importance of social distancing was discussed today. . Reviewed expectations re: course of current medical issues. . Discussed self-management of symptoms. . Outlined signs and symptoms indicating need for more acute intervention. . Patient verbalized understanding and all questions were answered. Marland Kitchen. Health Maintenance issues including appropriate healthy diet, exercise, and smoking avoidance were discussed with patient. . See orders for this visit as documented in the electronic medical record.  Helane RimaErica Jace Fermin, DO  Records requested if needed. Time spent: *** minutes, of which >50% was spent in obtaining information about her symptoms, reviewing her previous labs, evaluations, and treatments, counseling her about her condition (please see the discussed topics above), and developing a plan to further investigate it; she had a number of questions which I addressed.

## 2019-01-31 ENCOUNTER — Encounter: Payer: Self-pay | Admitting: Family Medicine

## 2019-01-31 ENCOUNTER — Other Ambulatory Visit: Payer: Self-pay

## 2019-01-31 ENCOUNTER — Ambulatory Visit (INDEPENDENT_AMBULATORY_CARE_PROVIDER_SITE_OTHER): Payer: PRIVATE HEALTH INSURANCE | Admitting: Family Medicine

## 2019-01-31 VITALS — Temp 98.6°F | Ht 63.0 in | Wt 185.0 lb

## 2019-01-31 DIAGNOSIS — R5381 Other malaise: Secondary | ICD-10-CM

## 2019-01-31 DIAGNOSIS — E538 Deficiency of other specified B group vitamins: Secondary | ICD-10-CM

## 2019-01-31 DIAGNOSIS — F902 Attention-deficit hyperactivity disorder, combined type: Secondary | ICD-10-CM

## 2019-01-31 DIAGNOSIS — R5383 Other fatigue: Secondary | ICD-10-CM

## 2019-01-31 DIAGNOSIS — F339 Major depressive disorder, recurrent, unspecified: Secondary | ICD-10-CM | POA: Diagnosis not present

## 2019-01-31 MED ORDER — B-12 1000 MCG PO CAPS: 1.0000 | ORAL_CAPSULE | Freq: Every day | ORAL | 2 refills | Status: AC

## 2019-01-31 MED ORDER — BUPROPION HCL ER (XL) 150 MG PO TB24: 150.0000 mg | ORAL_TABLET | Freq: Every day | ORAL | 3 refills | Status: DC

## 2019-01-31 NOTE — Progress Notes (Signed)
Virtual Visit via Video   Due to the COVID-19 pandemic, this visit was completed with telemedicine (audio/video) technology to reduce patient and provider exposure as well as to preserve personal protective equipment.   I connected with Renard MatterAshley Rybka by a video enabled telemedicine application and verified that I am speaking with the correct person using two identifiers. Location patient: Home Location provider: [facility], Office Persons participating in the virtual visit: Renard Mattershley Salzman, Helane RimaErica Orbie Grupe, DO Barnie MortJoEllen Thompson, CMA acting as scribe for Dr. Helane RimaErica Ernisha Sorn.             I discussed the limitations of evaluation and management by telemedicine and the availability of in person appointments. The patient expressed understanding and agreed to proceed.  Care Team   Patient Care Team: Helane RimaWallace, Richanda Darin, DO as PCP - General (Family Medicine) Marcelyn BruinsPadgett, Shaylar Patricia, MD as Consulting Physician (Allergy) Haze RushingGutterman, David L, PhD as Consulting Physician (Psychology) Maye HidesMiller, Ryan Dean, PA as Consulting Physician (Physician Assistant)  Subjective:   HPI: Previously doing very well on Lexapro and Adderall. Started noted that she had profound fatigue and lack of motivation. Felt Adderall crash around 2 hours after taking. Stopped medications. Hx of B12 deficiency on last lab check. Taking daily MVM now. Bed at 11 pm, wake at 9:30 am. Sleeps through the night with Trazodone. Walking each morning, eats protein for breakfast (bacon and eggs). Hx of weight loss from 210 to 135 pounds through VLCD. Now, weight up to 185 and she is not comfortable with her body. Boyfriend very supportive. ADHD Hx - best for her has seemed to be Adderall in the past, "more myself."  Review of Systems  Constitutional: Negative for chills and fever.  HENT: Negative for hearing loss and tinnitus.   Eyes: Negative for blurred vision and double vision.  Respiratory: Negative for cough.   Cardiovascular:  Negative for chest pain, palpitations and leg swelling.  Gastrointestinal: Negative for nausea and vomiting.  Genitourinary: Negative for dysuria and urgency.  Neurological: Negative for dizziness, tingling and headaches.  Psychiatric/Behavioral: Positive for depression. Negative for suicidal ideas.    Patient Active Problem List   Diagnosis Date Noted  . Seasonal and perennial allergic rhinitis 12/01/2018  . Allergic conjunctivitis of both eyes 12/01/2018  . Keratosis pilaris 12/01/2018  . Vitamin D deficiency 09/16/2018  . Moderate persistent asthma, uncomplicated 09/16/2018  . Allergic rhinitis 09/16/2018  . Irritable bowel syndrome 09/16/2018  . Depression, recurrent (HCC) 09/16/2018  . Primary insomnia 09/16/2018  . Attention deficit hyperactivity disorder (ADHD), combined type 09/16/2018    Social History   Tobacco Use  . Smoking status: Former Smoker    Packs/day: 1.00    Years: 4.00    Pack years: 4.00    Types: Cigarettes  . Smokeless tobacco: Never Used  Substance Use Topics  . Alcohol use: Yes    Comment: occasional   Current Outpatient Medications:  .  albuterol (PROVENTIL HFA;VENTOLIN HFA) 108 (90 Base) MCG/ACT inhaler, Inhale 2 puffs into the lungs every 4 (four) hours as needed for wheezing., Disp: 1 Inhaler, Rfl: 0 .  Azelastine-Fluticasone (DYMISTA) 137-50 MCG/ACT SUSP, Place into the nose., Disp: , Rfl:  .  dicyclomine (BENTYL) 20 MG tablet, Take 1 tablet (20 mg total) by mouth every 6 (six) hours., Disp: 90 tablet, Rfl: 1 .  loratadine (CLARITIN) 10 MG tablet, Take 10 mg by mouth daily., Disp: , Rfl:  .  norelgestromin-ethinyl estradiol (ORTHO EVRA) 150-35 MCG/24HR transdermal patch, Place 1 patch onto the skin  once a week., Disp: 3 patch, Rfl: 12 .  traZODone (DESYREL) 50 MG tablet, Take 1 tablet (50 mg total) by mouth at bedtime., Disp: 90 tablet, Rfl: 1 .  valACYclovir (VALTREX) 1000 MG tablet, Take 1 tablet (1,000 mg total) by mouth 2 (two) times daily.,  Disp: 180 tablet, Rfl: 1  Allergies  Allergen Reactions  . Other Diarrhea    [employer], Spicy foods, Pork , grape food coloring  . Pork-Derived Products Diarrhea    IBS   . Penicillins Other (See Comments)    unknown    Objective:   VITALS: Per patient if applicable, see vitals. GENERAL: Alert and in no acute distress. CARDIOPULMONARY: No increased WOB. Speaking in clear sentences.  PSYCH: Pleasant and cooperative. Speech normal rate and rhythm. Affect is appropriate. Insight and judgement are appropriate. Attention is focused, linear, and appropriate.  NEURO: Oriented as arrived to appointment on time with no prompting.   Depression screen PHQ 2/9 01/31/2019  Decreased Interest 1  Down, Depressed, Hopeless 1  PHQ - 2 Score 2  Altered sleeping 3  Tired, decreased energy 2  Change in appetite 2  Feeling bad or failure about yourself  2  Trouble concentrating 2  Moving slowly or fidgety/restless 0  Suicidal thoughts 0  PHQ-9 Score 13  Difficult doing work/chores Somewhat difficult   Assessment and Plan:   Donnalee was seen today for medication refill.  Diagnoses and all orders for this visit:  Attention deficit hyperactivity disorder (ADHD), combined type Comments: Continue current regimen for now. May adjust or change medication at three months follow up.  Malaise and fatigue Comments: DDx: B12 deficiency, depression, weight gain. Will trial Wellbutrin. Will supplement B12.  B12 deficiency -     Cyanocobalamin (B-12) 1000 MCG CAPS; Take 1 capsule by mouth daily.  Depression, recurrent (HCC) -     buPROPion (WELLBUTRIN XL) 150 MG 24 hr tablet; Take 1 tablet (150 mg total) by mouth daily.   Marland Kitchen COVID-19 Education: The signs and symptoms of COVID-19 were discussed with the patient and how to seek care for testing if needed. The importance of social distancing was discussed today. . Reviewed expectations re: course of current medical issues. . Discussed  self-management of symptoms. . Outlined signs and symptoms indicating need for more acute intervention. . Patient verbalized understanding and all questions were answered. Marland Kitchen Health Maintenance issues including appropriate healthy diet, exercise, and smoking avoidance were discussed with patient. . See orders for this visit as documented in the electronic medical record.  [name], DO  Records requested if needed. Time spent: 25 minutes, of which >50% was spent in obtaining information about her symptoms, reviewing her previous labs, evaluations, and treatments, counseling her about her condition (please see the discussed topics above), and developing a plan to further investigate it; she had a number of questions which I addressed.

## 2019-02-03 ENCOUNTER — Other Ambulatory Visit: Payer: Self-pay | Admitting: Family Medicine

## 2019-02-03 DIAGNOSIS — K589 Irritable bowel syndrome without diarrhea: Secondary | ICD-10-CM

## 2019-02-05 ENCOUNTER — Ambulatory Visit (INDEPENDENT_AMBULATORY_CARE_PROVIDER_SITE_OTHER): Payer: PRIVATE HEALTH INSURANCE | Admitting: Psychology

## 2019-02-05 DIAGNOSIS — F331 Major depressive disorder, recurrent, moderate: Secondary | ICD-10-CM

## 2019-02-09 ENCOUNTER — Ambulatory Visit (INDEPENDENT_AMBULATORY_CARE_PROVIDER_SITE_OTHER): Payer: PRIVATE HEALTH INSURANCE | Admitting: Psychology

## 2019-02-09 DIAGNOSIS — F331 Major depressive disorder, recurrent, moderate: Secondary | ICD-10-CM

## 2019-02-14 ENCOUNTER — Encounter: Payer: PRIVATE HEALTH INSURANCE | Admitting: Family Medicine

## 2019-02-19 ENCOUNTER — Ambulatory Visit (INDEPENDENT_AMBULATORY_CARE_PROVIDER_SITE_OTHER): Payer: PRIVATE HEALTH INSURANCE | Admitting: Psychology

## 2019-02-19 DIAGNOSIS — F331 Major depressive disorder, recurrent, moderate: Secondary | ICD-10-CM

## 2019-02-21 ENCOUNTER — Encounter: Payer: PRIVATE HEALTH INSURANCE | Admitting: Family Medicine

## 2019-02-25 NOTE — Progress Notes (Signed)
Subjective:    Brianna Wilkinson is a 28 y.o. female and is here for a comprehensive physical exam.  Wellbutrin started at last visit, helping a lot. Adderall used prn for ADHD and stable.     Current Outpatient Medications:  .  albuterol (PROVENTIL HFA;VENTOLIN HFA) 108 (90 Base) MCG/ACT inhaler, Inhale 2 puffs into the lungs every 4 (four) hours as needed for wheezing., Disp: 1 Inhaler, Rfl: 0 .  amphetamine-dextroamphetamine (ADDERALL XR) 20 MG 24 hr capsule, Take 1 capsule (20 mg total) by mouth every morning., Disp: 30 capsule, Rfl: 0 .  Azelastine-Fluticasone (DYMISTA) 137-50 MCG/ACT SUSP, Place into the nose., Disp: , Rfl:  .  buPROPion (WELLBUTRIN XL) 150 MG 24 hr tablet, Take 1 tablet (150 mg total) by mouth daily., Disp: 90 tablet, Rfl: 3 .  Cyanocobalamin (B-12) 1000 MCG CAPS, Take 1 capsule by mouth daily., Disp: 30 capsule, Rfl: 2 .  dicyclomine (BENTYL) 20 MG tablet, TAKE 1 TABLET EVERY 6 HOURS, Disp: 90 tablet, Rfl: 1 .  escitalopram (LEXAPRO) 10 MG tablet, TAKE 1 TABLET BY MOUTH EVERY DAY, Disp: 90 tablet, Rfl: 1 .  loratadine (CLARITIN) 10 MG tablet, Take 10 mg by mouth daily., Disp: , Rfl:  .  norelgestromin-ethinyl estradiol (ORTHO EVRA) 150-35 MCG/24HR transdermal patch, Place 1 patch onto the skin once a week., Disp: 3 patch, Rfl: 12 .  traZODone (DESYREL) 50 MG tablet, Take 1 tablet (50 mg total) by mouth at bedtime., Disp: 90 tablet, Rfl: 1 .  valACYclovir (VALTREX) 1000 MG tablet, Take 1 tablet (1,000 mg total) by mouth 2 (two) times daily., Disp: 180 tablet, Rfl: 1  Health Maintenance Due  Topic Date Due  . PAP-Cervical Cytology Screening  07/17/2013  . PAP SMEAR-Modifier  07/17/2013  . INFLUENZA VACCINE  02/17/2019   PMHx, SurgHx, SocialHx, Medications, and Allergies were reviewed in the Visit Navigator and updated as appropriate.   Past Medical History:  Diagnosis Date  . Angio-edema   . Anxiety   . Asthma   . Depression   . Eczema   . IBS (irritable  bowel syndrome)      Past Surgical History:  Procedure Laterality Date  . WISDOM TOOTH EXTRACTION       Family History  Problem Relation Age of Onset  . Allergic rhinitis Mother   . Depression Mother   . Early death Mother   . Mental illness Mother   . Miscarriages / [place] Mother   . Allergic rhinitis Father   . Depression Father   . Heart disease Father   . Hypertension Father   . Mental illness Father   . Allergic rhinitis Sister   . Mental illness Sister   . Depression Maternal Grandmother   . Early death Maternal Grandfather   . Heart attack Maternal Grandfather   . Hypertension Maternal Grandfather   . Heart disease Maternal Grandfather   . Early death Paternal Grandfather   . Heart attack Paternal Grandfather   . Hypertension Paternal Grandfather   . Allergic rhinitis Sister   . Mental illness Sister     Social History   Tobacco Use  . Smoking status: Former Smoker    Packs/day: 1.00    Years: 4.00    Pack years: 4.00    Types: Cigarettes  . Smokeless tobacco: Never Used  Substance Use Topics  . Alcohol use: Yes    Comment: occasional  . Drug use: Yes    Types: Marijuana    Comment: ocassional  Review of Systems:   Pertinent items are noted in the HPI. Otherwise, ROS is negative.  Objective:   BP 110/90 (BP Location: Left Arm, Patient Position: Sitting, Cuff Size: Normal)   Pulse 96   Temp 97.7 F (36.5 C) (Temporal)   Ht 5\' 3"  (1.6 m)   Wt 187 lb 6.4 oz (85 kg)   LMP 02/11/2019   SpO2 100%   BMI 33.20 kg/m    General appearance: alert, cooperative and appears stated age. Head: normocephalic, without obvious abnormality, atraumatic. Neck: no adenopathy, supple, symmetrical, trachea midline; thyroid not enlarged, symmetric, no tenderness/mass/nodules. Lungs: clear to auscultation bilaterally. Breasts: inspection negative, no nipple retraction or dimpling, no nipple discharge or bleeding, no axillary or supraclavicular adenopathy,  normal to palpation without dominant masses. Heart: regular rate and rhythm Abdomen: soft, non-tender; no masses,  no organomegaly. Extremities: extremities normal, atraumatic, no cyanosis or edema. Skin: skin color, texture, turgor normal, no rashes or lesions. Lymph: cervical, supraclavicular, and axillary nodes normal; no abnormal inguinal nodes palpated. Neurologic: grossly normal.  Pelvic:  External genitalia: no lesions. Urethra: normal appearing urethra with no masses, tenderness or lesions. Bartholins and Skenes: normal. Vagina: normal appearing vagina with normal color and discharge, scar tissue at perineum and rectum. Cervix: normal appearance. Pap done: Yes.   Bimanual Exam:   Uterus: uterus is normal size, shape, consistency and nontender. Adnexa: normal adnexa in size, nontender and no masses.  Assessment/Plan:   Brianna Wilkinson was seen today for annual exam.  Diagnoses and all orders for this visit:  Routine physical examination  Attention deficit hyperactivity disorder (ADHD), combined type  Weight gain  Vitamin D deficiency -     VITAMIN D 25 Hydroxy (Vit-D Deficiency, Fractures)  B12 deficiency -     Vitamin B12  Vulvodynia -     CBC with Differential/Platelet -     Comprehensive metabolic panel -     HSV(herpes simplex vrs) 1+2 ab-IgG  Screening for cervical cancer -     Cytology - PAP    Patient Counseling:   [x]     Nutrition: Stressed importance of moderation in sodium/caffeine intake, saturated fat and cholesterol, caloric balance, sufficient intake of fresh fruits, vegetables, fiber, calcium, iron, and 1 mg of folate supplement per day (for females capable of pregnancy).   [x]      Stressed the importance of regular exercise.    [x]     Substance Abuse: Discussed cessation/primary prevention of tobacco, alcohol, or other drug use; driving or other dangerous activities under the influence; availability of treatment for abuse.    [x]      Injury prevention:  Discussed safety belts, safety helmets, smoke detector, smoking near bedding or upholstery.    [x]      Sexuality: Discussed sexually transmitted diseases, partner selection, use of condoms, avoidance of unintended pregnancy  and contraceptive alternatives.    [x]     Dental health: Discussed importance of regular tooth brushing, flossing, and dental visits.   [x]      Health maintenance and immunizations reviewed. Please refer to Health maintenance section.   Brianna RimaErica Joahan Swatzell, DO [facility]

## 2019-02-26 ENCOUNTER — Encounter: Payer: Self-pay | Admitting: Family Medicine

## 2019-02-26 ENCOUNTER — Other Ambulatory Visit: Payer: Self-pay

## 2019-02-26 ENCOUNTER — Other Ambulatory Visit (HOSPITAL_COMMUNITY)
Admission: RE | Admit: 2019-02-26 | Discharge: 2019-02-26 | Disposition: A | Discharge status: Home / Self Care | Payer: PRIVATE HEALTH INSURANCE | Source: Ambulatory Visit | Attending: Family Medicine | Admitting: Family Medicine

## 2019-02-26 ENCOUNTER — Ambulatory Visit (INDEPENDENT_AMBULATORY_CARE_PROVIDER_SITE_OTHER): Payer: PRIVATE HEALTH INSURANCE | Admitting: Family Medicine

## 2019-02-26 VITALS — BP 110/90 | HR 96 | Temp 97.7°F | Ht 63.0 in | Wt 187.4 lb

## 2019-02-26 DIAGNOSIS — Z124 Encounter for screening for malignant neoplasm of cervix: Secondary | ICD-10-CM

## 2019-02-26 DIAGNOSIS — F902 Attention-deficit hyperactivity disorder, combined type: Secondary | ICD-10-CM | POA: Diagnosis not present

## 2019-02-26 DIAGNOSIS — Z0000 Encounter for general adult medical examination without abnormal findings: Secondary | ICD-10-CM | POA: Diagnosis not present

## 2019-02-26 DIAGNOSIS — N94819 Vulvodynia, unspecified: Secondary | ICD-10-CM

## 2019-02-26 DIAGNOSIS — E559 Vitamin D deficiency, unspecified: Secondary | ICD-10-CM | POA: Diagnosis not present

## 2019-02-26 DIAGNOSIS — E538 Deficiency of other specified B group vitamins: Secondary | ICD-10-CM

## 2019-02-26 DIAGNOSIS — R635 Abnormal weight gain: Secondary | ICD-10-CM

## 2019-02-26 LAB — CBC WITH DIFFERENTIAL/PLATELET
Basophils Absolute: 0.1 10*3/uL (ref 0.0–0.1)
Basophils Relative: 0.7 % (ref 0.0–3.0)
Eosinophils Absolute: 0.2 10*3/uL (ref 0.0–0.7)
Eosinophils Relative: 2.5 % (ref 0.0–5.0)
HCT: 41.7 % (ref 36.0–46.0)
Hemoglobin: 13.9 g/dL (ref 12.0–15.0)
Lymphocytes Relative: 28.8 % (ref 12.0–46.0)
Lymphs Abs: 2.7 10*3/uL (ref 0.7–4.0)
MCHC: 33.3 g/dL (ref 30.0–36.0)
MCV: 90.9 fl (ref 78.0–100.0)
Monocytes Absolute: 0.5 10*3/uL (ref 0.1–1.0)
Monocytes Relative: 5.3 % (ref 3.0–12.0)
Neutro Abs: 5.9 10*3/uL (ref 1.4–7.7)
Neutrophils Relative %: 62.7 % (ref 43.0–77.0)
Platelets: 286 10*3/uL (ref 150.0–400.0)
RBC: 4.59 Mil/uL (ref 3.87–5.11)
RDW: 13.5 % (ref 11.5–15.5)
WBC: 9.4 10*3/uL (ref 4.0–10.5)

## 2019-02-26 LAB — COMPREHENSIVE METABOLIC PANEL
ALT: 20 U/L (ref 0–35)
AST: 15 U/L (ref 0–37)
Albumin: 3.7 g/dL (ref 3.5–5.2)
Alkaline Phosphatase: 83 U/L (ref 39–117)
BUN: 9 mg/dL (ref 6–23)
CO2: 25 mEq/L (ref 19–32)
Calcium: 9 mg/dL (ref 8.4–10.5)
Chloride: 106 mEq/L (ref 96–112)
Creatinine, Ser: 0.73 mg/dL (ref 0.40–1.20)
GFR: 95.91 mL/min (ref >60.00)
Glucose, Bld: 90 mg/dL (ref 70–99)
Potassium: 4 mEq/L (ref 3.5–5.1)
Sodium: 140 mEq/L (ref 135–145)
Total Bilirubin: 0.3 mg/dL (ref 0.2–1.2)
Total Protein: 6.1 g/dL (ref 6.0–8.3)

## 2019-02-26 LAB — VITAMIN B12: Vitamin B-12: 136 pg/mL — ABNORMAL LOW (ref 211–911)

## 2019-02-26 LAB — VITAMIN D 25 HYDROXY (VIT D DEFICIENCY, FRACTURES): VITD: 17.65 ng/mL — ABNORMAL LOW (ref 30.00–100.00)

## 2019-02-26 NOTE — Patient Instructions (Addendum)
Health Maintenance, Female Adopting a healthy lifestyle and getting preventive care are important in promoting health and wellness. Ask your health care provider about:  The right schedule for you to have regular tests and exams.  Things you can do on your own to prevent diseases and keep yourself healthy. What should I know about diet, weight, and exercise? Eat a healthy diet   Eat a diet that includes plenty of vegetables, fruits, low-fat dairy products, and lean protein.  Do not eat a lot of foods that are high in solid fats, added sugars, or sodium. Maintain a healthy weight Body mass index (BMI) is used to identify weight problems. It estimates body fat based on height and weight. Your health care provider can help determine your BMI and help you achieve or maintain a healthy weight. Get regular exercise Get regular exercise. This is one of the most important things you can do for your health. Most adults should:  Exercise for at least 150 minutes each week. The exercise should increase your heart rate and make you sweat (moderate-intensity exercise).  Do strengthening exercises at least twice a week. This is in addition to the moderate-intensity exercise.  Spend less time sitting. Even light physical activity can be beneficial. Watch cholesterol and blood lipids Have your blood tested for lipids and cholesterol at 27 years of age, then have this test every 5 years. Have your cholesterol levels checked more often if:  Your lipid or cholesterol levels are high.  You are older than 27 years of age.  You are at high risk for heart disease. What should I know about cancer screening? Depending on your health history and family history, you may need to have cancer screening at various ages. This may include screening for:  Breast cancer.  Cervical cancer.  Colorectal cancer.  Skin cancer.  Lung cancer. What should I know about heart disease, diabetes, and high blood  pressure? Blood pressure and heart disease  High blood pressure causes heart disease and increases the risk of stroke. This is more likely to develop in people who have high blood pressure readings, are of African descent, or are overweight.  Have your blood pressure checked: ? Every 3-5 years if you are 18-39 years of age. ? Every year if you are 40 years old or older. Diabetes Have regular diabetes screenings. This checks your fasting blood sugar level. Have the screening done:  Once every three years after age 40 if you are at a normal weight and have a low risk for diabetes.  More often and at a younger age if you are overweight or have a high risk for diabetes. What should I know about preventing infection? Hepatitis B If you have a higher risk for hepatitis B, you should be screened for this virus. Talk with your health care provider to find out if you are at risk for hepatitis B infection. Hepatitis C Testing is recommended for:  Everyone born from 1945 through 1965.  Anyone with known risk factors for hepatitis C. Sexually transmitted infections (STIs)  Get screened for STIs, including gonorrhea and chlamydia, if: ? You are sexually active and are younger than 27 years of age. ? You are older than 27 years of age and your health care provider tells you that you are at risk for this type of infection. ? Your sexual activity has changed since you were last screened, and you are at increased risk for chlamydia or gonorrhea. Ask your health care provider if   you are at risk.  Ask your health care provider about whether you are at high risk for HIV. Your health care provider may recommend a prescription medicine to help prevent HIV infection. If you choose to take medicine to prevent HIV, you should first get tested for HIV. You should then be tested every 3 months for as long as you are taking the medicine. Pregnancy  If you are about to stop having your period (premenopausal) and  you may become pregnant, seek counseling before you get pregnant.  Take 400 to 800 micrograms (mcg) of folic acid every day if you become pregnant.  Ask for birth control (contraception) if you want to prevent pregnancy. Osteoporosis and menopause Osteoporosis is a disease in which the bones lose minerals and strength with aging. This can result in bone fractures. If you are 65 years old or older, or if you are at risk for osteoporosis and fractures, ask your health care provider if you should:  Be screened for bone loss.  Take a calcium or vitamin D supplement to lower your risk of fractures.  Be given hormone replacement therapy (HRT) to treat symptoms of menopause. Follow these instructions at home: Lifestyle  Do not use any products that contain nicotine or tobacco, such as cigarettes, e-cigarettes, and chewing tobacco. If you need help quitting, ask your health care provider.  Do not use street drugs.  Do not share needles.  Ask your health care provider for help if you need support or information about quitting drugs. Alcohol use  Do not drink alcohol if: ? Your health care provider tells you not to drink. ? You are pregnant, may be pregnant, or are planning to become pregnant.  If you drink alcohol: ? Limit how much you use to 0-1 drink a day. ? Limit intake if you are breastfeeding.  Be aware of how much alcohol is in your drink. In the [place] one drink equals one 12 oz bottle of beer (355 mL), one 5 oz glass of wine (148 mL), or one 1 oz glass of hard liquor (44 mL). General instructions  Schedule regular health, dental, and eye exams.  Stay current with your vaccines.  Tell your health care provider if: ? You often feel depressed. ? You have ever been abused or do not feel safe at home. Summary  Adopting a healthy lifestyle and getting preventive care are important in promoting health and wellness.  Follow your health care provider's instructions about healthy  diet, exercising, and getting tested or screened for diseases.  Follow your health care provider's instructions on monitoring your cholesterol and blood pressure. This information is not intended to replace advice given to you by your health care provider. Make sure you discuss any questions you have with your health care provider. Document Released: 01/18/2011 Document Revised: 06/28/2018 Document Reviewed: 06/28/2018 Elsevier Patient Education  2020 Elsevier Inc.  

## 2019-02-27 ENCOUNTER — Other Ambulatory Visit: Payer: Self-pay

## 2019-02-27 LAB — CYTOLOGY - PAP
Adequacy: ABSENT
Chlamydia: NEGATIVE
Diagnosis: NEGATIVE
Neisseria Gonorrhea: NEGATIVE
Trichomonas: NEGATIVE

## 2019-02-27 LAB — HSV(HERPES SIMPLEX VRS) I + II AB-IGG
HAV 1 IGG,TYPE SPECIFIC AB: 29.3 index — ABNORMAL HIGH
HSV 2 IGG,TYPE SPECIFIC AB: <0.9 index

## 2019-02-27 MED ORDER — VITAMIN D (ERGOCALCIFEROL) 1.25 MG (50000 UNIT) PO CAPS: 50000.0000 [IU] | ORAL_CAPSULE | ORAL | 0 refills | Status: DC

## 2019-02-27 NOTE — Progress Notes (Signed)
Vitamin d

## 2019-03-05 ENCOUNTER — Ambulatory Visit (INDEPENDENT_AMBULATORY_CARE_PROVIDER_SITE_OTHER): Payer: PRIVATE HEALTH INSURANCE | Admitting: Psychology

## 2019-03-05 DIAGNOSIS — F331 Major depressive disorder, recurrent, moderate: Secondary | ICD-10-CM | POA: Diagnosis not present

## 2019-03-11 ENCOUNTER — Other Ambulatory Visit: Payer: Self-pay | Admitting: Family Medicine

## 2019-03-11 DIAGNOSIS — F5101 Primary insomnia: Secondary | ICD-10-CM

## 2019-03-16 ENCOUNTER — Ambulatory Visit (INDEPENDENT_AMBULATORY_CARE_PROVIDER_SITE_OTHER): Payer: PRIVATE HEALTH INSURANCE | Admitting: Family Medicine

## 2019-03-16 ENCOUNTER — Encounter: Payer: Self-pay | Admitting: Family Medicine

## 2019-03-16 VITALS — Temp 97.0°F | Ht 63.0 in | Wt 180.0 lb

## 2019-03-16 DIAGNOSIS — F3341 Major depressive disorder, recurrent, in partial remission: Secondary | ICD-10-CM

## 2019-03-16 DIAGNOSIS — F5101 Primary insomnia: Secondary | ICD-10-CM

## 2019-03-16 DIAGNOSIS — F902 Attention-deficit hyperactivity disorder, combined type: Secondary | ICD-10-CM | POA: Diagnosis not present

## 2019-03-16 DIAGNOSIS — N943 Premenstrual tension syndrome: Secondary | ICD-10-CM

## 2019-03-16 MED ORDER — NORGESTIM-ETH ESTRAD TRIPHASIC 0.18/0.215/0.25 MG-25 MCG PO TABS: 1.0000 | ORAL_TABLET | Freq: Every day | ORAL | 11 refills | Status: DC

## 2019-03-16 NOTE — Progress Notes (Signed)
Virtual Visit via Video   Wilkinson to the COVID-19 pandemic, this visit was completed with telemedicine (audio/video) technology to reduce patient and provider exposure as well as to preserve personal protective equipment.   I connected with Brianna Wilkinson by a video enabled telemedicine application and verified that I am speaking with the correct person using two identifiers. Location patient: Home Location provider: [facility], Office Persons participating in the virtual visit: Brianna Wilkinson, Brianna RimaErica Joshue Badal, DO   I discussed the limitations of evaluation and management by telemedicine and the availability of in person appointments. The patient expressed understanding and agreed to proceed.  Care Team   Patient Care Team: Brianna RimaWallace, [name], DO as PCP - General (Family Medicine) Marcelyn BruinsPadgett, Shaylar Patricia, MD as Consulting Physician (Allergy) Haze RushingGutterman, David L, PhD as Consulting Physician (Psychology) Maye HidesMiller, Ryan Dean, PA as Consulting Physician (Physician Assistant)  Subjective:   HPI: Wellbutrin was added at our last visit.  She feels much more motivated.  Patient is doing very well on current regimen.  No side effects.  Noticing some increased anxiety and anger during her premenstrual week.  Developes a mild headache just before her period starts.  Review of Systems  Constitutional: Negative for chills, fever, malaise/fatigue and weight loss.  Respiratory: Negative for cough, shortness of breath and wheezing.   Cardiovascular: Negative for chest pain, palpitations and leg swelling.  Gastrointestinal: Negative for abdominal pain, constipation, diarrhea, nausea and vomiting.  Genitourinary: Negative for dysuria and urgency.  Musculoskeletal: Negative for joint pain and myalgias.  Skin: Negative for rash.  Neurological: Negative for dizziness and headaches.  Psychiatric/Behavioral: Negative for depression, substance abuse and suicidal ideas. The patient is not nervous/anxious.       Patient Active Problem List   Diagnosis Date Noted  . Seasonal and perennial allergic rhinitis 12/01/2018  . Allergic conjunctivitis of both eyes 12/01/2018  . Keratosis pilaris 12/01/2018  . Vitamin D deficiency 09/16/2018  . Moderate persistent asthma, uncomplicated 09/16/2018  . Allergic rhinitis 09/16/2018  . Irritable bowel syndrome 09/16/2018  . Depression, recurrent (HCC) 09/16/2018  . Primary insomnia 09/16/2018  . Attention deficit hyperactivity disorder (ADHD), combined type 09/16/2018    Social History   Tobacco Use  . Smoking status: Former Smoker    Packs/day: 1.00    Years: 4.00    Pack years: 4.00    Types: Cigarettes  . Smokeless tobacco: Never Used  Substance Use Topics  . Alcohol use: Yes    Comment: occasional   Current Outpatient Medications:  .  albuterol (PROVENTIL HFA;VENTOLIN HFA) 108 (90 Base) MCG/ACT inhaler, Inhale 2 puffs into the lungs every 4 (four) hours as needed for wheezing., Disp: 1 Inhaler, Rfl: 0 .  amphetamine-dextroamphetamine (ADDERALL XR) 20 MG 24 hr capsule, Take 1 capsule (20 mg total) by mouth every morning., Disp: 30 capsule, Rfl: 0 .  Azelastine-Fluticasone (DYMISTA) 137-50 MCG/ACT SUSP, Place into the nose., Disp: , Rfl:  .  buPROPion (WELLBUTRIN XL) 150 MG 24 hr tablet, Take 1 tablet (150 mg total) by mouth daily., Disp: 90 tablet, Rfl: 3 .  Cyanocobalamin (B-12) 1000 MCG CAPS, Take 1 capsule by mouth daily., Disp: 30 capsule, Rfl: 2 .  dicyclomine (BENTYL) 20 MG tablet, TAKE 1 TABLET EVERY 6 HOURS, Disp: 90 tablet, Rfl: 1 .  escitalopram (LEXAPRO) 10 MG tablet, TAKE 1 TABLET BY MOUTH EVERY DAY, Disp: 90 tablet, Rfl: 1 .  loratadine (CLARITIN) 10 MG tablet, Take 10 mg by mouth daily., Disp: , Rfl:  .  norelgestromin-ethinyl  estradiol (ORTHO EVRA) 150-35 MCG/24HR transdermal patch, Place 1 patch onto the skin once a week., Disp: 3 patch, Rfl: 12 .  traZODone (DESYREL) 50 MG tablet, TAKE 1 TABLET BY MOUTH EVERYDAY AT BEDTIME,  Disp: 90 tablet, Rfl: 1 .  valACYclovir (VALTREX) 1000 MG tablet, Take 1 tablet (1,000 mg total) by mouth 2 (two) times daily., Disp: 180 tablet, Rfl: 1 .  Vitamin D, Ergocalciferol, (DRISDOL) 1.25 MG (50000 UT) CAPS capsule, Take 1 capsule (50,000 Units total) by mouth every 7 (seven) days., Disp: 12 capsule, Rfl: 0  Allergies  Allergen Reactions  . Other Diarrhea    [employer], Spicy foods, Pork , grape food coloring  . Pork-Derived Products Diarrhea    IBS   . Penicillins Other (See Comments)    unknown    Objective:   VITALS: Per patient if applicable, see vitals. GENERAL: Alert, appears well and in no acute distress. HEENT: Atraumatic, conjunctiva clear, no obvious abnormalities on inspection of external nose and ears. NECK: Normal movements of the head and neck. CARDIOPULMONARY: No increased WOB. Speaking in clear sentences. I:E ratio WNL.  MS: Moves all visible extremities without noticeable abnormality. PSYCH: Pleasant and cooperative, well-groomed. Speech normal rate and rhythm. Affect is appropriate. Insight and judgement are appropriate. Attention is focused, linear, and appropriate.  NEURO: CN grossly intact. Oriented as arrived to appointment on time with no prompting. Moves both UE equally.  SKIN: No obvious lesions, wounds, erythema, or cyanosis noted on face or hands.  Depression screen [facility] 2/9 02/26/2019 01/31/2019  Decreased Interest 1 1  Down, Depressed, Hopeless 1 1  PHQ - 2 Score 2 2  Altered sleeping 3 3  Tired, decreased energy 1 2  Change in appetite 1 2  Feeling bad or failure about yourself  1 2  Trouble concentrating 0 2  Moving slowly or fidgety/restless 1 0  Suicidal thoughts 0 0  PHQ-9 Score 9 13  Difficult doing work/chores Somewhat difficult Somewhat difficult    Assessment and Plan:   [name] was seen today for medication dose change and medication discussion.  Diagnoses and all orders for this visit:  Recurrent major depressive  disorder, in partial remission ([place]) Comments: Improved motivation with Wellbutrin. Likes this combination.  Attention deficit hyperactivity disorder (ADHD), combined type Comments: Controlled on current regimen.  Primary insomnia Comments: Controlled on current regimen.  Premenstrual syndrome Comments: Moderate. Discussed increasing Lexapro to 20 mg daily or during PMS. Will hold for now.   Other orders -     Norgestimate-Ethinyl Estradiol Triphasic (ORTHO TRI-CYCLEN LO) 0.18/0.215/0.25 MG-25 MCG tab; Take 1 tablet by mouth daily.    Marland Kitchen COVID-19 Education: The signs and symptoms of COVID-19 were discussed with the patient and how to seek care for testing if needed. The importance of social distancing was discussed today. . Reviewed expectations re: course of current medical issues. . Discussed self-management of symptoms. . Outlined signs and symptoms indicating need for more acute intervention. . Patient verbalized understanding and all questions were answered. Marland Kitchen Health Maintenance issues including appropriate healthy diet, exercise, and smoking avoidance were discussed with patient. . See orders for this visit as documented in the electronic medical record.  [name], DO  Records requested if needed. Time spent: 25 minutes, of which >50% was spent in obtaining information about her symptoms, reviewing her previous labs, evaluations, and treatments, counseling her about her condition (please see the discussed topics above), and developing a plan to further investigate it; she had a  number of questions which I addressed.

## 2019-03-19 ENCOUNTER — Ambulatory Visit (INDEPENDENT_AMBULATORY_CARE_PROVIDER_SITE_OTHER): Payer: PRIVATE HEALTH INSURANCE | Admitting: Psychology

## 2019-03-19 ENCOUNTER — Encounter: Payer: Self-pay | Admitting: Family Medicine

## 2019-03-19 DIAGNOSIS — F331 Major depressive disorder, recurrent, moderate: Secondary | ICD-10-CM | POA: Diagnosis not present

## 2019-03-28 ENCOUNTER — Ambulatory Visit (INDEPENDENT_AMBULATORY_CARE_PROVIDER_SITE_OTHER): Payer: PRIVATE HEALTH INSURANCE | Admitting: Physician Assistant

## 2019-03-28 ENCOUNTER — Encounter: Payer: Self-pay | Admitting: Physician Assistant

## 2019-03-28 VITALS — Temp 99.0°F

## 2019-03-28 DIAGNOSIS — R059 Cough, unspecified: Secondary | ICD-10-CM

## 2019-03-28 DIAGNOSIS — Z7189 Other specified counseling: Secondary | ICD-10-CM

## 2019-03-28 DIAGNOSIS — R05 Cough: Secondary | ICD-10-CM

## 2019-03-28 NOTE — Progress Notes (Signed)
Virtual Visit via Video   I connected with Brianna Wilkinson on 03/28/19 at  3:40 PM EDT by a video enabled telemedicine application and verified that I am speaking with the correct person using two identifiers. Location patient: Home Location provider: [facility], Office Persons participating in the virtual visit: Brianna Wilkinson, [place] PA-C.  I discussed the limitations of evaluation and management by telemedicine and the availability of in person appointments. The patient expressed understanding and agreed to proceed.  I acted as a Education administrator for [employer], [employer], LPN  Subjective:   HPI:   Sinus problem Pt is c/o nasal congestion/runny nose, yellow nasal drainage. Also having headaches, sinus pressure x 3 days. She taking Tylenol 1500 mg with some relief. Having some chills and her rosacea is acting up temp was 99.0. Pt also having diarrhea past few days but has IBS.  Worst symptoms for her right now are fatigue and headache.  She does work at a Human resources officer.  ROS: See pertinent positives and negatives per HPI.  Patient Active Problem List   Diagnosis Date Noted  . Seasonal and perennial allergic rhinitis 12/01/2018  . Allergic conjunctivitis of both eyes 12/01/2018  . Keratosis pilaris 12/01/2018  . Vitamin D deficiency 09/16/2018  . Moderate persistent asthma, uncomplicated 96/29/5284  . Allergic rhinitis 09/16/2018  . Irritable bowel syndrome 09/16/2018  . Depression, recurrent ([place]) 09/16/2018  . Primary insomnia 09/16/2018  . Attention deficit hyperactivity disorder (ADHD), combined type 09/16/2018    Social History   Tobacco Use  . Smoking status: Former Smoker    Packs/day: 1.00    Years: 4.00    Pack years: 4.00    Types: Cigarettes  . Smokeless tobacco: Never Used  Substance Use Topics  . Alcohol use: Yes    Comment: occasional    Current Outpatient Medications:  .  albuterol (PROVENTIL HFA;VENTOLIN HFA) 108 (90 Base)  MCG/ACT inhaler, Inhale 2 puffs into the lungs every 4 (four) hours as needed for wheezing., Disp: 1 Inhaler, Rfl: 0 .  amphetamine-dextroamphetamine (ADDERALL XR) 20 MG 24 hr capsule, Take 1 capsule (20 mg total) by mouth every morning., Disp: 30 capsule, Rfl: 0 .  Azelastine-Fluticasone (DYMISTA) 137-50 MCG/ACT SUSP, Place into the nose., Disp: , Rfl:  .  buPROPion (WELLBUTRIN XL) 150 MG 24 hr tablet, Take 1 tablet (150 mg total) by mouth daily., Disp: 90 tablet, Rfl: 3 .  Cyanocobalamin (B-12) 1000 MCG CAPS, Take 1 capsule by mouth daily., Disp: 30 capsule, Rfl: 2 .  dicyclomine (BENTYL) 20 MG tablet, TAKE 1 TABLET EVERY 6 HOURS, Disp: 90 tablet, Rfl: 1 .  escitalopram (LEXAPRO) 10 MG tablet, TAKE 1 TABLET BY MOUTH EVERY DAY, Disp: 90 tablet, Rfl: 1 .  loratadine (CLARITIN) 10 MG tablet, Take 10 mg by mouth daily., Disp: , Rfl:  .  Multiple Vitamin (MULTIVITAMIN) tablet, Take 1 tablet by mouth daily., Disp: , Rfl:  .  Norgestimate-Ethinyl Estradiol Triphasic (ORTHO TRI-CYCLEN LO) 0.18/0.215/0.25 MG-25 MCG tab, Take 1 tablet by mouth daily., Disp: 1 Package, Rfl: 11 .  traZODone (DESYREL) 50 MG tablet, TAKE 1 TABLET BY MOUTH EVERYDAY AT BEDTIME, Disp: 90 tablet, Rfl: 1 .  valACYclovir (VALTREX) 1000 MG tablet, Take 1 tablet (1,000 mg total) by mouth 2 (two) times daily., Disp: 180 tablet, Rfl: 1  Allergies  Allergen Reactions  . Other Diarrhea    [employer], Spicy foods, Pork , grape food coloring  . Pork-Derived Products Diarrhea    IBS   .  Penicillins Other (See Comments)    unknown    Objective:   VITALS: Per patient if applicable, see vitals. GENERAL: Alert, appears well and in no acute distress. HEENT: Atraumatic, conjunctiva clear, no obvious abnormalities on inspection of external nose and ears. NECK: Normal movements of the head and neck. CARDIOPULMONARY: No increased WOB. Speaking in clear sentences. I:E ratio WNL.  MS: Moves all visible extremities without noticeable  abnormality. PSYCH: Pleasant and cooperative, well-groomed. Speech normal rate and rhythm. Affect is appropriate. Insight and judgement are appropriate. Attention is focused, linear, and appropriate.  NEURO: CN grossly intact. Oriented as arrived to appointment on time with no prompting. Moves both UE equally.  SKIN: No obvious lesions, wounds, erythema, or cyanosis noted on face or hands.  Assessment and Plan:   Brianna Wilkinson was seen today for sinus problem.  Diagnoses and all orders for this visit:  Cough -     Novel Coronavirus, NAA ([facility])  Advice Given About Covid-19 Virus Infection -     Novel Coronavirus, NAA ([facility])   Patient has a respiratory illness without signs of acute distress or respiratory compromise at this time. This is likely a viral infection, which can come from a number of respiratory viruses. We are going to send patient for drive-up testing. As a precaution, they have been advised to remain home until COVID-19 results and then possible further quarantine after that based on results and symptoms. Advised if they experience a "second sickening" or worsening symptoms as the illness progresses, they are to call the office for further instructions or seek emergent evaluation for any severe symptoms.    . Reviewed expectations re: course of current medical issues. . Discussed self-management of symptoms. . Outlined signs and symptoms indicating need for more acute intervention. . Patient verbalized understanding and all questions were answered. Marland Kitchen. Health Maintenance issues including appropriate healthy diet, exercise, and smoking avoidance were discussed with patient. . See orders for this visit as documented in the electronic medical record.  I discussed the assessment and treatment plan with the patient. The patient was provided an opportunity to ask questions and all were answered. The patient agreed with the plan and demonstrated an understanding of the instructions.    The patient was advised to call back or seek an in-person evaluation if the symptoms worsen or if the condition fails to improve as anticipated.   CMA or LPN served as scribe during this visit. History, Physical, and Plan performed by medical provider. The above documentation has been reviewed and is accurate and complete.   RaymondSamantha Karee Christopherson, [place] 03/28/2019

## 2019-03-29 ENCOUNTER — Other Ambulatory Visit: Payer: Self-pay

## 2019-03-29 DIAGNOSIS — Z20822 Contact with and (suspected) exposure to covid-19: Secondary | ICD-10-CM

## 2019-03-30 ENCOUNTER — Encounter: Payer: Self-pay | Admitting: Family Medicine

## 2019-03-30 LAB — NOVEL CORONAVIRUS, NAA: SARS-CoV-2, NAA: NOT DETECTED

## 2019-04-02 ENCOUNTER — Ambulatory Visit (INDEPENDENT_AMBULATORY_CARE_PROVIDER_SITE_OTHER): Payer: PRIVATE HEALTH INSURANCE | Admitting: Psychology

## 2019-04-02 ENCOUNTER — Other Ambulatory Visit: Payer: Self-pay | Admitting: Physician Assistant

## 2019-04-02 DIAGNOSIS — F331 Major depressive disorder, recurrent, moderate: Secondary | ICD-10-CM | POA: Diagnosis not present

## 2019-04-02 MED ORDER — AZITHROMYCIN 250 MG PO TABS: ORAL_TABLET | ORAL | 0 refills | Status: DC

## 2019-04-16 ENCOUNTER — Ambulatory Visit: Payer: PRIVATE HEALTH INSURANCE | Admitting: Psychology

## 2019-04-17 ENCOUNTER — Ambulatory Visit (INDEPENDENT_AMBULATORY_CARE_PROVIDER_SITE_OTHER): Payer: PRIVATE HEALTH INSURANCE | Admitting: Psychology

## 2019-04-17 DIAGNOSIS — F331 Major depressive disorder, recurrent, moderate: Secondary | ICD-10-CM | POA: Diagnosis not present

## 2019-04-28 ENCOUNTER — Other Ambulatory Visit: Payer: Self-pay | Admitting: Family Medicine

## 2019-04-28 DIAGNOSIS — F339 Major depressive disorder, recurrent, unspecified: Secondary | ICD-10-CM

## 2019-04-30 ENCOUNTER — Ambulatory Visit (INDEPENDENT_AMBULATORY_CARE_PROVIDER_SITE_OTHER): Payer: PRIVATE HEALTH INSURANCE | Admitting: Psychology

## 2019-04-30 DIAGNOSIS — F331 Major depressive disorder, recurrent, moderate: Secondary | ICD-10-CM | POA: Diagnosis not present

## 2019-05-03 ENCOUNTER — Other Ambulatory Visit: Payer: Self-pay | Admitting: Family Medicine

## 2019-05-03 DIAGNOSIS — F339 Major depressive disorder, recurrent, unspecified: Secondary | ICD-10-CM

## 2019-05-07 ENCOUNTER — Ambulatory Visit (INDEPENDENT_AMBULATORY_CARE_PROVIDER_SITE_OTHER): Payer: PRIVATE HEALTH INSURANCE | Admitting: Psychology

## 2019-05-07 DIAGNOSIS — F331 Major depressive disorder, recurrent, moderate: Secondary | ICD-10-CM | POA: Diagnosis not present

## 2019-05-14 ENCOUNTER — Ambulatory Visit (INDEPENDENT_AMBULATORY_CARE_PROVIDER_SITE_OTHER): Payer: PRIVATE HEALTH INSURANCE | Admitting: Psychology

## 2019-05-14 DIAGNOSIS — F331 Major depressive disorder, recurrent, moderate: Secondary | ICD-10-CM | POA: Diagnosis not present

## 2019-05-28 ENCOUNTER — Ambulatory Visit (INDEPENDENT_AMBULATORY_CARE_PROVIDER_SITE_OTHER): Payer: PRIVATE HEALTH INSURANCE | Admitting: Psychology

## 2019-05-28 DIAGNOSIS — F331 Major depressive disorder, recurrent, moderate: Secondary | ICD-10-CM | POA: Diagnosis not present

## 2019-06-01 ENCOUNTER — Ambulatory Visit: Payer: PRIVATE HEALTH INSURANCE | Admitting: Family Medicine

## 2019-06-10 ENCOUNTER — Encounter (INDEPENDENT_AMBULATORY_CARE_PROVIDER_SITE_OTHER): Payer: Self-pay | Admitting: Family Medicine

## 2019-06-11 ENCOUNTER — Ambulatory Visit (INDEPENDENT_AMBULATORY_CARE_PROVIDER_SITE_OTHER): Payer: PRIVATE HEALTH INSURANCE | Admitting: Psychology

## 2019-06-11 DIAGNOSIS — F331 Major depressive disorder, recurrent, moderate: Secondary | ICD-10-CM | POA: Diagnosis not present

## 2019-06-11 NOTE — Telephone Encounter (Signed)
Left a voicemail for the patient to call to schedule a TOC appointment and to inquire what she wanted to schedule an appointment for as it was not specified in the previous note. Awaiting patient's call. [facility] can schedule appointments also. No need to route note, for documentation purposes only

## 2019-06-11 NOTE — Telephone Encounter (Signed)
Please review

## 2019-06-22 ENCOUNTER — Encounter: Payer: Self-pay | Admitting: Physician Assistant

## 2019-06-22 ENCOUNTER — Ambulatory Visit (INDEPENDENT_AMBULATORY_CARE_PROVIDER_SITE_OTHER): Payer: PRIVATE HEALTH INSURANCE | Admitting: Family Medicine

## 2019-06-22 VITALS — Temp 101.8°F

## 2019-06-22 DIAGNOSIS — R197 Diarrhea, unspecified: Secondary | ICD-10-CM | POA: Diagnosis not present

## 2019-06-22 MED ORDER — ONDANSETRON 8 MG PO TBDP: 8.0000 mg | ORAL_TABLET | Freq: Three times a day (TID) | ORAL | 0 refills | Status: DC | PRN

## 2019-06-22 NOTE — Progress Notes (Signed)
    Chief Complaint:  Brianna Wilkinson is a 28 y.o. female who presents today for a virtual office visit with a chief complaint of diarrhea.   Assessment/Plan:  Diarrhea Likely gastroenteritis.  Discussed limitations of virtual visit and inability to perform abdominal exam, however patient was able to palpate abdomen without any peritoneal signs.  We will treat symptomatically with Zofran and Imodium.  Encouraged good oral hydration.  Discussed reasons to seek emergent care including inability to tolerate p.o., worsening abdominal pain, worsening fatigue, or lack of improvement with the above treatment.    Subjective:  HPI:  Diarrhea Started today. Associated symptoms include fever, vomiting, abdominal pain, chills, body aches, and fatigue.  Thinks that she may have gotten food poisoning from something she ate yesterday.  No melena or hematochezia.  No hematemesis.  Tried Bentyl with no improvement.  No other treatments tried.  No known sick contacts.  No other obvious aggravating or alleviating factors.  ROS: Per HPI  PMH: She reports that she has quit smoking. Her smoking use included cigarettes. She has a 4.00 pack-year smoking history. She has never used smokeless tobacco. She reports current alcohol use. She reports current drug use. Drug: Marijuana.      Objective/Observations  Physical Exam: Gen: NAD, resting comfortably Pulm: Normal work of breathing Neuro: Grossly normal, moves all extremities Psych: Normal affect and thought content GI: No apparent guarding or rebound tenderness based on visual inspection  No results found for this or any previous visit (from the past 24 hour(s)).   Virtual Visit via Video   I connected with Wyatt Portela on 06/22/19 at 11:20 AM EST by a video enabled telemedicine application and verified that I am speaking with the correct person using two identifiers. I discussed the limitations of evaluation and management by telemedicine and the  availability of in person appointments. The patient expressed understanding and agreed to proceed.   Patient location: Home Provider location: [place] participating in the virtual visit: Myself and Patient     Algis Greenhouse. Jerline Pain, MD 06/22/2019 11:28 AM

## 2019-06-23 ENCOUNTER — Telehealth: Payer: Self-pay

## 2019-06-23 NOTE — Telephone Encounter (Signed)
Left detailed vm message for patient to return call to schedule appt (doxy visit) MyChart message sent yesterday morning 12/4 by pt stating that she had low-grade fever and wasn't feeling well.

## 2019-06-24 ENCOUNTER — Encounter: Payer: Self-pay | Admitting: Physician Assistant

## 2019-06-28 ENCOUNTER — Ambulatory Visit: Payer: PRIVATE HEALTH INSURANCE | Admitting: Allergy

## 2019-07-10 ENCOUNTER — Ambulatory Visit (INDEPENDENT_AMBULATORY_CARE_PROVIDER_SITE_OTHER): Payer: PRIVATE HEALTH INSURANCE | Admitting: Psychology

## 2019-07-10 DIAGNOSIS — F331 Major depressive disorder, recurrent, moderate: Secondary | ICD-10-CM | POA: Diagnosis not present

## 2019-07-23 ENCOUNTER — Ambulatory Visit (INDEPENDENT_AMBULATORY_CARE_PROVIDER_SITE_OTHER): Payer: 59 | Admitting: Psychology

## 2019-07-23 DIAGNOSIS — F331 Major depressive disorder, recurrent, moderate: Secondary | ICD-10-CM

## 2019-08-02 ENCOUNTER — Ambulatory Visit (INDEPENDENT_AMBULATORY_CARE_PROVIDER_SITE_OTHER): Payer: 59 | Admitting: Family Medicine

## 2019-08-02 ENCOUNTER — Other Ambulatory Visit: Payer: 59

## 2019-08-02 DIAGNOSIS — J4540 Moderate persistent asthma, uncomplicated: Secondary | ICD-10-CM

## 2019-08-02 NOTE — Progress Notes (Signed)
   Brianna Wilkinson is a 29 y.o. female who presents today for a virtual office visit.  Assessment/Plan:  Chronic Problems Addressed Today: Moderate persistent asthma, uncomplicated Stable.  Continue albuterol as needed.  Will give note explaining that patient is at high risk for complication from COVID-19 infection.     Subjective:  HPI:  See A/P.       Objective/Observations  Physical Exam: Gen: NAD, resting comfortably Pulm: Normal work of breathing Neuro: Grossly normal, moves all extremities Psych: Normal affect and thought content  Virtual Visit via Video   I connected with Brianna Wilkinson on 08/02/19 at  9:20 AM EST by a video enabled telemedicine application and verified that I am speaking with the correct person using two identifiers. The limitations of evaluation and management by telemedicine and the availability of in person appointments were discussed. The patient expressed understanding and agreed to proceed.   Patient location: Home Provider location:  [place] [employer] Persons participating in the virtual visit: Myself and Patient     Katina Degree. Jimmey Ralph, MD 08/02/2019 9:41 AM

## 2019-08-02 NOTE — Assessment & Plan Note (Signed)
Stable.  Continue albuterol as needed.  Will give note explaining that patient is at high risk for complication from COVID-19 infection.

## 2019-08-06 ENCOUNTER — Ambulatory Visit: Payer: PRIVATE HEALTH INSURANCE | Admitting: Psychology

## 2019-08-06 ENCOUNTER — Ambulatory Visit: Payer: 59 | Attending: Internal Medicine

## 2019-08-06 DIAGNOSIS — Z20822 Contact with and (suspected) exposure to covid-19: Secondary | ICD-10-CM

## 2019-08-07 LAB — NOVEL CORONAVIRUS, NAA: SARS-CoV-2, NAA: NOT DETECTED

## 2019-08-09 ENCOUNTER — Ambulatory Visit (INDEPENDENT_AMBULATORY_CARE_PROVIDER_SITE_OTHER): Payer: 59 | Admitting: Psychology

## 2019-08-09 DIAGNOSIS — F331 Major depressive disorder, recurrent, moderate: Secondary | ICD-10-CM

## 2019-08-20 ENCOUNTER — Ambulatory Visit: Payer: PRIVATE HEALTH INSURANCE | Admitting: Psychology

## 2019-09-03 ENCOUNTER — Ambulatory Visit (INDEPENDENT_AMBULATORY_CARE_PROVIDER_SITE_OTHER): Payer: 59 | Admitting: Psychology

## 2019-09-03 DIAGNOSIS — F331 Major depressive disorder, recurrent, moderate: Secondary | ICD-10-CM | POA: Diagnosis not present

## 2019-09-05 ENCOUNTER — Telehealth: Payer: Self-pay | Admitting: Family Medicine

## 2019-09-05 NOTE — Telephone Encounter (Signed)
Patient called in wanting to know if she could switch her prescription Norgestimate-Ethinyl Estradiol Triphasic (ORTHO TRI-CYCLEN LO) 0.18/0.215/0.25 MG-25 MCG tab for something else, she switched insurance and it will no longer be covered,.

## 2019-09-06 ENCOUNTER — Encounter: Payer: Self-pay | Admitting: *Deleted

## 2019-09-06 NOTE — Telephone Encounter (Signed)
MyChart message sent to patient.

## 2019-09-11 ENCOUNTER — Encounter: Payer: Self-pay | Admitting: Family Medicine

## 2019-09-12 ENCOUNTER — Encounter: Payer: Self-pay | Admitting: Family Medicine

## 2019-09-13 ENCOUNTER — Other Ambulatory Visit: Payer: Self-pay

## 2019-09-13 MED ORDER — NORGESTIM-ETH ESTRAD TRIPHASIC 0.18/0.215/0.25 MG-35 MCG PO TABS: 1.0000 | ORAL_TABLET | Freq: Every day | ORAL | 11 refills | Status: DC

## 2019-09-17 ENCOUNTER — Ambulatory Visit (INDEPENDENT_AMBULATORY_CARE_PROVIDER_SITE_OTHER): Payer: 59 | Admitting: Psychology

## 2019-09-17 DIAGNOSIS — F331 Major depressive disorder, recurrent, moderate: Secondary | ICD-10-CM

## 2019-09-19 ENCOUNTER — Encounter: Payer: Self-pay | Admitting: Family Medicine

## 2019-09-22 ENCOUNTER — Encounter: Payer: Self-pay | Admitting: Family Medicine

## 2019-09-24 NOTE — Telephone Encounter (Signed)
LVM for patient to call back and schedule a virtual appt with Dr. Jimmey Ralph

## 2019-09-24 NOTE — Telephone Encounter (Signed)
Please call pt and schedule a visit. 

## 2019-09-25 ENCOUNTER — Other Ambulatory Visit: Payer: Self-pay

## 2019-09-26 ENCOUNTER — Encounter: Payer: Self-pay | Admitting: Family Medicine

## 2019-09-26 ENCOUNTER — Ambulatory Visit (INDEPENDENT_AMBULATORY_CARE_PROVIDER_SITE_OTHER): Payer: 59 | Admitting: Family Medicine

## 2019-09-26 VITALS — BP 104/70 | HR 103 | Temp 98.0°F | Ht 63.0 in | Wt 199.2 lb

## 2019-09-26 DIAGNOSIS — F5101 Primary insomnia: Secondary | ICD-10-CM | POA: Diagnosis not present

## 2019-09-26 DIAGNOSIS — J3089 Other allergic rhinitis: Secondary | ICD-10-CM | POA: Diagnosis not present

## 2019-09-26 DIAGNOSIS — Z0001 Encounter for general adult medical examination with abnormal findings: Secondary | ICD-10-CM | POA: Diagnosis not present

## 2019-09-26 DIAGNOSIS — H6983 Other specified disorders of Eustachian tube, bilateral: Secondary | ICD-10-CM

## 2019-09-26 DIAGNOSIS — J4540 Moderate persistent asthma, uncomplicated: Secondary | ICD-10-CM

## 2019-09-26 DIAGNOSIS — K589 Irritable bowel syndrome without diarrhea: Secondary | ICD-10-CM

## 2019-09-26 DIAGNOSIS — F902 Attention-deficit hyperactivity disorder, combined type: Secondary | ICD-10-CM

## 2019-09-26 DIAGNOSIS — J302 Other seasonal allergic rhinitis: Secondary | ICD-10-CM

## 2019-09-26 DIAGNOSIS — E669 Obesity, unspecified: Secondary | ICD-10-CM

## 2019-09-26 DIAGNOSIS — Z6835 Body mass index (BMI) 35.0-35.9, adult: Secondary | ICD-10-CM

## 2019-09-26 DIAGNOSIS — H1013 Acute atopic conjunctivitis, bilateral: Secondary | ICD-10-CM

## 2019-09-26 DIAGNOSIS — F339 Major depressive disorder, recurrent, unspecified: Secondary | ICD-10-CM | POA: Diagnosis not present

## 2019-09-26 DIAGNOSIS — J309 Allergic rhinitis, unspecified: Secondary | ICD-10-CM

## 2019-09-26 MED ORDER — HYDROXYZINE HCL 50 MG PO TABS: 25.0000 mg | ORAL_TABLET | Freq: Every evening | ORAL | 1 refills | Status: AC | PRN

## 2019-09-26 MED ORDER — PREDNISONE 20 MG PO TABS: 20.0000 mg | ORAL_TABLET | Freq: Every day | ORAL | 0 refills | Status: DC

## 2019-09-26 MED ORDER — BUPROPION HCL ER (XL) 300 MG PO TB24: 300.0000 mg | ORAL_TABLET | Freq: Every day | ORAL | 3 refills | Status: DC

## 2019-09-26 NOTE — Patient Instructions (Signed)
It was very nice to see you today!  Please try the hydroxyzine for your sleep.  He has a lot of fluid behind your ears.  Please start the prednisone.   You can try using Pataday for your eyes.  We will increase your Wellbutrin to 300 mg daily.  No other changes today.  You are due for your next Pap smear in 2023.  Come back in 1 year for your next physical.  Please check in with me via MyChart in a few weeks.  Take care, Dr Jerline Pain  Please try these tips to maintain a healthy lifestyle:   Eat at least 3 REAL meals and 1-2 snacks per day.  Aim for no more than 5 hours between eating.  If you eat breakfast, please do so within one hour of getting up.    Each meal should contain half fruits/vegetables, one quarter protein, and one quarter carbs (no bigger than a computer mouse)   Cut down on sweet beverages. This includes juice, soda, and sweet tea.     Drink at least 1 glass of water with each meal and aim for at least 8 glasses per day   Exercise at least 150 minutes every week.    Preventive Care 72-4 Years Old, Female Preventive care refers to visits with your health care provider and lifestyle choices that can promote health and wellness. This includes:  A yearly physical exam. This may also be called an annual well check.  Regular dental visits and eye exams.  Immunizations.  Screening for certain conditions.  Healthy lifestyle choices, such as eating a healthy diet, getting regular exercise, not using drugs or products that contain nicotine and tobacco, and limiting alcohol use. What can I expect for my preventive care visit? Physical exam Your health care provider will check your:  Height and weight. This may be used to calculate body mass index (BMI), which tells if you are at a healthy weight.  Heart rate and blood pressure.  Skin for abnormal spots. Counseling Your health care provider may ask you questions about your:  Alcohol, tobacco, and drug  use.  Emotional well-being.  Home and relationship well-being.  Sexual activity.  Eating habits.  Work and work Statistician.  Method of birth control.  Menstrual cycle.  Pregnancy history. What immunizations do I need?  Influenza (flu) vaccine  This is recommended every year. Tetanus, diphtheria, and pertussis (Tdap) vaccine  You may need a Td booster every 10 years. Varicella (chickenpox) vaccine  You may need this if you have not been vaccinated. Human papillomavirus (HPV) vaccine  If recommended by your health care provider, you may need three doses over 6 months. Measles, mumps, and rubella (MMR) vaccine  You may need at least one dose of MMR. You may also need a second dose. Meningococcal conjugate (MenACWY) vaccine  One dose is recommended if you are age 48-21 years and a first-year college student living in a residence hall, or if you have one of several medical conditions. You may also need additional booster doses. Pneumococcal conjugate (PCV13) vaccine  You may need this if you have certain conditions and were not previously vaccinated. Pneumococcal polysaccharide (PPSV23) vaccine  You may need one or two doses if you smoke cigarettes or if you have certain conditions. Hepatitis A vaccine  You may need this if you have certain conditions or if you travel or work in places where you may be exposed to hepatitis A. Hepatitis B vaccine  You may need  this if you have certain conditions or if you travel or work in places where you may be exposed to hepatitis B. Haemophilus influenzae type b (Hib) vaccine  You may need this if you have certain conditions. You may receive vaccines as individual doses or as more than one vaccine together in one shot (combination vaccines). Talk with your health care provider about the risks and benefits of combination vaccines. What tests do I need?  Blood tests  Lipid and cholesterol levels. These may be checked every 5  years starting at age 47.  Hepatitis C test.  Hepatitis B test. Screening  Diabetes screening. This is done by checking your blood sugar (glucose) after you have not eaten for a while (fasting).  Sexually transmitted disease (STD) testing.  BRCA-related cancer screening. This may be done if you have a family history of breast, ovarian, tubal, or peritoneal cancers.  Pelvic exam and Pap test. This may be done every 3 years starting at age 71. Starting at age 95, this may be done every 5 years if you have a Pap test in combination with an HPV test. Talk with your health care provider about your test results, treatment options, and if necessary, the need for more tests. Follow these instructions at home: Eating and drinking   Eat a diet that includes fresh fruits and vegetables, whole grains, lean protein, and low-fat dairy.  Take vitamin and mineral supplements as recommended by your health care provider.  Do not drink alcohol if: ? Your health care provider tells you not to drink. ? You are pregnant, may be pregnant, or are planning to become pregnant.  If you drink alcohol: ? Limit how much you have to 0-1 drink a day. ? Be aware of how much alcohol is in your drink. In the [place] one drink equals one 12 oz bottle of beer (355 mL), one 5 oz glass of wine (148 mL), or one 1 oz glass of hard liquor (44 mL). Lifestyle  Take daily care of your teeth and gums.  Stay active. Exercise for at least 30 minutes on 5 or more days each week.  Do not use any products that contain nicotine or tobacco, such as cigarettes, e-cigarettes, and chewing tobacco. If you need help quitting, ask your health care provider.  If you are sexually active, practice safe sex. Use a condom or other form of birth control (contraception) in order to prevent pregnancy and STIs (sexually transmitted infections). If you plan to become pregnant, see your health care provider for a preconception visit. What's  next?  Visit your health care provider once a year for a well check visit.  Ask your health care provider how often you should have your eyes and teeth checked.  Stay up to date on all vaccines. This information is not intended to replace advice given to you by your health care provider. Make sure you discuss any questions you have with your health care provider. Document Revised: 03/16/2018 Document Reviewed: 03/16/2018 Elsevier Patient Education  2020 [employer].

## 2019-09-26 NOTE — Assessment & Plan Note (Signed)
Recommended over-the-counter Pataday drops.

## 2019-09-26 NOTE — Assessment & Plan Note (Signed)
Stable.  Continue Bentyl 20 mg as needed.

## 2019-09-26 NOTE — Assessment & Plan Note (Signed)
Continue Adderall 20mg  daily as needed.

## 2019-09-26 NOTE — Assessment & Plan Note (Signed)
Continue Claritin 10 mg daily and Dymista nasal spray as needed.

## 2019-09-26 NOTE — Assessment & Plan Note (Signed)
Stable but uncontrolled.  We will increase Wellbutrin to 300 mg daily.  Continue Lexapro 10 mg daily.  Follow-up in a few weeks via MyChart.

## 2019-09-26 NOTE — Assessment & Plan Note (Signed)
Uncontrolled.  We will stop trazodone.  Start hydroxyzine.  She will check in with me in a few weeks via MyChart.Brianna Wilkinson

## 2019-09-26 NOTE — Progress Notes (Signed)
Chief Complaint:  Brianna Wilkinson is a 29 y.o. female who presents today for her annual comprehensive physical exam.    Assessment/Plan:  New/Acute Problems: Eustachian tube dysfunction Secondary to allergic rhinitis.  Start prednisone 20 mg x 5 days.  She will continue taking Dymista and Claritin.  Chronic Problems Addressed Today: Allergic conjunctivitis of both eyes Recommended over-the-counter Pataday drops.  Seasonal and perennial allergic rhinitis Continue Claritin 10 mg daily and Dymista nasal spray as needed.  Attention deficit hyperactivity disorder (ADHD), combined type Continue Adderall 20mg  daily as needed.  Primary insomnia Uncontrolled.  We will stop trazodone.  Start hydroxyzine.  She will check in with me in a few weeks via MyChart..  Depression, recurrent ([place]) Stable but uncontrolled.  We will increase Wellbutrin to 300 mg daily.  Continue Lexapro 10 mg daily.  Follow-up in a few weeks via MyChart.  Irritable bowel syndrome Stable.  Continue Bentyl 20 mg as needed.  Moderate persistent asthma, uncomplicated Stable.  Continue albuterol as needed.   Body mass index is 35.3 kg/m. / Obese BMI Metric Follow Up - 09/26/19 1047      BMI Metric Follow Up-Please document annually   BMI Metric Follow Up  Education provided        Preventative Healthcare: Up-to-date on vaccines and screenings.  Patient Counseling(The following topics were reviewed and/or handout was given):  -Nutrition: Stressed importance of moderation in sodium/caffeine intake, saturated fat and cholesterol, caloric balance, sufficient intake of fresh fruits, vegetables, and fiber.  -Stressed the importance of regular exercise.   -Substance Abuse: Discussed cessation/primary prevention of tobacco, alcohol, or other drug use; driving or other dangerous activities under the influence; availability of treatment for abuse.   -Injury prevention: Discussed safety belts, safety helmets, smoke  detector, smoking near bedding or upholstery.   -Sexuality: Discussed sexually transmitted diseases, partner selection, use of condoms, avoidance of unintended pregnancy and contraceptive alternatives.   -Dental health: Discussed importance of regular tooth brushing, flossing, and dental visits.  -Health maintenance and immunizations reviewed. Please refer to Health maintenance section.  Return to care in 1 year for next preventative visit.     Subjective:  HPI:  Patient has had intermittent left ear pain for the past few weeks.  Occurs for 2 to 3 days and then subsides.  No obvious injuries.  No fevers or chills.  No precipitating events.  No treatments tried.  She has also had more difficulty falling asleep.  She is on trazodone which is not working.   Her stable, chronic medical conditions are outlined below:  # ADHD - On adderall 20mg  daily as needed.  Uses very sparingly.  # Asthma / Allergies -On Claritin 10 mg daily, Dymista as needed, and albuterol inhaler as needed.  # Depression / Insomnia - On Wellbutrin 150mg  daily and lexapro 10mg  daily - On trazodone 50mg  nightly  # IBS - Uses Bentyl as needed  Lifestyle Diet: None.  Exercise: None.   Depression screen PHQ 2/9 09/26/2019  Decreased Interest 2  Down, Depressed, Hopeless 2  PHQ - 2 Score 4  Altered sleeping 3  Tired, decreased energy 3  Change in appetite 3  Feeling bad or failure about yourself  2  Trouble concentrating 2  Moving slowly or fidgety/restless 1  Suicidal thoughts 1  PHQ-9 Score 19  Difficult doing work/chores Somewhat difficult    There are no preventive care reminders to display for this patient.   ROS: Per HPI, otherwise a complete review of systems  was negative.   PMH:  The following were reviewed and entered/updated in epic: Past Medical History:  Diagnosis Date  . Angio-edema   . Anxiety   . Asthma   . Depression   . Eczema   . IBS (irritable bowel syndrome)    Patient  Active Problem List   Diagnosis Date Noted  . Seasonal and perennial allergic rhinitis 12/01/2018  . Allergic conjunctivitis of both eyes 12/01/2018  . Keratosis pilaris 12/01/2018  . Vitamin D deficiency 09/16/2018  . Moderate persistent asthma, uncomplicated 09/16/2018  . Irritable bowel syndrome 09/16/2018  . Depression, recurrent (HCC) 09/16/2018  . Primary insomnia 09/16/2018  . Attention deficit hyperactivity disorder (ADHD), combined type 09/16/2018   Past Surgical History:  Procedure Laterality Date  . WISDOM TOOTH EXTRACTION      Family History  Problem Relation Age of Onset  . Allergic rhinitis Mother   . Depression Mother   . Early death Mother   . Mental illness Mother   . Miscarriages / [place] Mother   . Allergic rhinitis Father   . Depression Father   . Heart disease Father   . Hypertension Father   . Mental illness Father   . Allergic rhinitis Sister   . Mental illness Sister   . Depression Maternal Grandmother   . Early death Maternal Grandfather   . Heart attack Maternal Grandfather   . Hypertension Maternal Grandfather   . Heart disease Maternal Grandfather   . Early death Paternal Grandfather   . Heart attack Paternal Grandfather   . Hypertension Paternal Grandfather   . Allergic rhinitis Sister   . Mental illness Sister     Medications- reviewed and updated Current Outpatient Medications  Medication Sig Dispense Refill  . albuterol (PROVENTIL HFA;VENTOLIN HFA) 108 (90 Base) MCG/ACT inhaler Inhale 2 puffs into the lungs every 4 (four) hours as needed for wheezing. 1 Inhaler 0  . amphetamine-dextroamphetamine (ADDERALL XR) 20 MG 24 hr capsule Take 1 capsule (20 mg total) by mouth every morning. 30 capsule 0  . Azelastine-Fluticasone (DYMISTA) 137-50 MCG/ACT SUSP Place into the nose.    Marland Kitchen buPROPion (WELLBUTRIN XL) 300 MG 24 hr tablet Take 1 tablet (300 mg total) by mouth daily. 90 tablet 3  . Cyanocobalamin (B-12) 1000 MCG CAPS Take 1 capsule by  mouth daily. 30 capsule 2  . dicyclomine (BENTYL) 20 MG tablet TAKE 1 TABLET EVERY 6 HOURS 90 tablet 1  . escitalopram (LEXAPRO) 10 MG tablet TAKE 1 TABLET BY MOUTH EVERY DAY 90 tablet 3  . loratadine (CLARITIN) 10 MG tablet Take 10 mg by mouth daily.    . Multiple Vitamin (MULTIVITAMIN) tablet Take 1 tablet by mouth daily.    . Norgestimate-Ethinyl Estradiol Triphasic 0.18/0.215/0.25 MG-35 MCG tablet Take 1 tablet by mouth daily. 1 Package 11  . valACYclovir (VALTREX) 1000 MG tablet Take 1 tablet (1,000 mg total) by mouth 2 (two) times daily. 180 tablet 1  . hydrOXYzine (ATARAX/VISTARIL) 50 MG tablet Take 0.5-2 tablets (25-100 mg total) by mouth at bedtime as needed (insomnia). 90 tablet 1  . predniSONE (DELTASONE) 20 MG tablet Take 1 tablet (20 mg total) by mouth daily with breakfast. 5 tablet 0   No current facility-administered medications for this visit.    Allergies-reviewed and updated Allergies  Allergen Reactions  . Other Diarrhea    [employer], Spicy foods, Pork , grape food coloring  . Pork-Derived Products Diarrhea    IBS   . Penicillins Other (See Comments)  unknown    Social History   Socioeconomic History  . Marital status: Single    Spouse name: Not on file  . Number of children: Not on file  . Years of education: Not on file  . Highest education level: Associate degree: occupational, Scientist, product/process development, or vocational program  Occupational History    Employer: [employer]  Tobacco Use  . Smoking status: Former Smoker    Packs/day: 1.00    Years: 4.00    Pack years: 4.00    Types: Cigarettes  . Smokeless tobacco: Never Used  Substance and Sexual Activity  . Alcohol use: Yes    Comment: occasional  . Drug use: Yes    Types: Marijuana    Comment: ocassional  . Sexual activity: Yes    Birth control/protection: Pill  Other Topics Concern  . Not on file  Social History Narrative  . Not on file   Social Determinants of Health   Financial Resource  Strain:   . Difficulty of Paying Living Expenses: Not on file  Food Insecurity:   . Worried About Programme researcher, broadcasting/film/video in the Last Year: Not on file  . Ran Out of Food in the Last Year: Not on file  Transportation Needs:   . Lack of Transportation (Medical): Not on file  . Lack of Transportation (Non-Medical): Not on file  Physical Activity:   . Days of Exercise per Week: Not on file  . Minutes of Exercise per Session: Not on file  Stress:   . Feeling of Stress : Not on file  Social Connections:   . Frequency of Communication with Friends and Family: Not on file  . Frequency of Social Gatherings with Friends and Family: Not on file  . Attends Religious Services: Not on file  . Active Member of Clubs or Organizations: Not on file  . Attends Banker Meetings: Not on file  . Marital Status: Not on file        Objective:  Physical Exam: BP 104/70   Pulse (!) 103   Temp 98 F (36.7 C)   Ht 5\' 3"  (1.6 m)   Wt 199 lb 4 oz (90.4 kg)   LMP 08/31/2019   SpO2 99%   BMI 35.30 kg/m   Body mass index is 35.3 kg/m. Wt Readings from Last 3 Encounters:  09/26/19 199 lb 4 oz (90.4 kg)  03/16/19 180 lb (81.6 kg)  02/26/19 187 lb 6.4 oz (85 kg)   Gen: NAD, resting comfortably HEENT: Left TM with clear effusion.  OP clear. No thyromegaly noted.  CV: RRR with no murmurs appreciated Pulm: NWOB, CTAB with no crackles, wheezes, or rhonchi GI: Normal bowel sounds present. Soft, Nontender, Nondistended. MSK: no edema, cyanosis, or clubbing noted Skin: warm, dry Neuro: CN2-12 grossly intact. Strength 5/5 in upper and lower extremities. Reflexes symmetric and intact bilaterally.  Psych: Normal affect and thought content     Cameryn Chrisley M. 04/28/19, MD 09/26/2019 10:48 AM

## 2019-09-26 NOTE — Assessment & Plan Note (Signed)
Stable. Continue albuterol as needed.  

## 2019-10-01 ENCOUNTER — Ambulatory Visit (INDEPENDENT_AMBULATORY_CARE_PROVIDER_SITE_OTHER): Payer: 59 | Admitting: Psychology

## 2019-10-01 DIAGNOSIS — F331 Major depressive disorder, recurrent, moderate: Secondary | ICD-10-CM | POA: Diagnosis not present

## 2019-10-15 ENCOUNTER — Ambulatory Visit: Payer: PRIVATE HEALTH INSURANCE | Admitting: Psychology

## 2019-10-15 ENCOUNTER — Ambulatory Visit (INDEPENDENT_AMBULATORY_CARE_PROVIDER_SITE_OTHER): Payer: 59 | Admitting: Psychology

## 2019-10-15 DIAGNOSIS — F331 Major depressive disorder, recurrent, moderate: Secondary | ICD-10-CM

## 2019-10-29 ENCOUNTER — Ambulatory Visit (INDEPENDENT_AMBULATORY_CARE_PROVIDER_SITE_OTHER): Payer: 59 | Admitting: Psychology

## 2019-10-29 DIAGNOSIS — F331 Major depressive disorder, recurrent, moderate: Secondary | ICD-10-CM

## 2019-11-04 ENCOUNTER — Other Ambulatory Visit: Payer: Self-pay | Admitting: Family Medicine

## 2019-11-05 ENCOUNTER — Encounter: Payer: Self-pay | Admitting: Family Medicine

## 2019-11-05 ENCOUNTER — Other Ambulatory Visit: Payer: Self-pay

## 2019-11-05 MED ORDER — NORGESTIM-ETH ESTRAD TRIPHASIC 0.18/0.215/0.25 MG-35 MCG PO TABS: 1.0000 | ORAL_TABLET | Freq: Every day | ORAL | 3 refills | Status: DC

## 2019-11-07 ENCOUNTER — Other Ambulatory Visit: Payer: Self-pay | Admitting: Family Medicine

## 2019-11-12 ENCOUNTER — Ambulatory Visit: Payer: PRIVATE HEALTH INSURANCE | Admitting: Psychology

## 2019-11-15 ENCOUNTER — Other Ambulatory Visit: Payer: Self-pay

## 2019-11-15 ENCOUNTER — Telehealth (INDEPENDENT_AMBULATORY_CARE_PROVIDER_SITE_OTHER): Payer: 59 | Admitting: Family Medicine

## 2019-11-15 ENCOUNTER — Encounter: Payer: Self-pay | Admitting: Family Medicine

## 2019-11-15 DIAGNOSIS — R52 Pain, unspecified: Secondary | ICD-10-CM | POA: Diagnosis not present

## 2019-11-15 DIAGNOSIS — R05 Cough: Secondary | ICD-10-CM | POA: Diagnosis not present

## 2019-11-15 DIAGNOSIS — R059 Cough, unspecified: Secondary | ICD-10-CM

## 2019-11-15 DIAGNOSIS — R0981 Nasal congestion: Secondary | ICD-10-CM | POA: Diagnosis not present

## 2019-11-15 DIAGNOSIS — R509 Fever, unspecified: Secondary | ICD-10-CM | POA: Diagnosis not present

## 2019-11-15 MED ORDER — OSELTAMIVIR PHOSPHATE 75 MG PO CAPS
75.0000 mg | ORAL_CAPSULE | Freq: Two times a day (BID) | ORAL | 0 refills | Status: DC
Start: 2019-11-15 — End: 2020-03-18

## 2019-11-15 NOTE — Progress Notes (Signed)
Formatting of this note might be different from the original.    Self Swab Type: Anterior Nasal  Electronically signed by Barbarann Ehlers at 11/15/2019  4:36 PM EDT

## 2019-11-15 NOTE — Telephone Encounter (Signed)
Spoke with patient. Stated had 103.5 F oral at 4am, cough and nausea. Patient took Ibuprofen and Mucinex.  Recheck tempeture about 7am temperature 101 F oral  Patient schedule virtual appointment today.

## 2019-11-15 NOTE — Patient Instructions (Addendum)
  Brianna Wilkinson,  -I sent the medication(s) we discussed to your pharmacy: Meds ordered this encounter  Medications  . oseltamivir (TAMIFLU) 75 MG capsule    Sig: Take 1 capsule (75 mg total) by mouth 2 (two) times daily.    Dispense:  10 capsule    Refill:  0    Please let us know if you have any questions or concerns regarding this prescription.  Please get a COVID test, though it is much less likely with the vaccine, some cases are still possible.  I hope you are feeling better soon! Seek care promptly if your symptoms worsen, new concerns arise or you are not improving with treatment.   WORK SLIP:  Please excuse patient Brianna Wilkinson,  1991-03-13, from work according to the [employer] guidelines for a COVID like illness. We advise 10 days minimum from the onset of symptoms or if fully vaccinated for COVID19 a negative COVID19 test PLUS 2-3 days of no fever and feeling better. We advise following all recommend COVID19 prevention guidelines and recommendations per the CDC.  Sincerely: E-signature: Dr. Kriste Basque, DO [facility] Ph: [phone]

## 2019-11-15 NOTE — Progress Notes (Addendum)
Virtual Visit via Video Note  I connected with Brianna Wilkinson  on 11/15/19 at 10:40 AM EDT by a video enabled telemedicine application and verified that I am speaking with the correct person using two identifiers.  Location patient: home Location provider:work or home office Persons participating in the virtual visit: patient, provider  I discussed the limitations of evaluation and management by telemedicine and the availability of in person appointments. The patient expressed understanding and agreed to proceed.   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  Past Medical History:  Diagnosis Date  . Angio-edema   . Anxiety   . Asthma   . Depression   . Eczema   . IBS (irritable bowel syndrome)     Past Surgical History:  Procedure Laterality Date  . WISDOM TOOTH EXTRACTION      Family History  Problem Relation Age of Onset  . Allergic rhinitis Mother   . Depression Mother   . Early death Mother   . Mental illness Mother   . Miscarriages / [place] Mother   . Allergic rhinitis Father   . Depression Father   . Heart disease Father   . Hypertension Father   . Mental illness Father   . Allergic rhinitis Sister   .  Mental illness Sister   . Depression Maternal Grandmother   . Early death Maternal Grandfather   . Heart attack Maternal Grandfather   . Hypertension Maternal Grandfather   . Heart disease Maternal Grandfather   . Early death Paternal Grandfather   . Heart attack Paternal Grandfather   . Hypertension Paternal Grandfather   . Allergic rhinitis Sister   . Mental illness Sister        Current Outpatient Medications:  .  albuterol (PROVENTIL HFA;VENTOLIN HFA) 108 (90 Base) MCG/ACT inhaler, Inhale 2 puffs into the lungs every 4 (four) hours as needed for wheezing., Disp: 1 Inhaler, Rfl: 0 .  amphetamine-dextroamphetamine (ADDERALL XR) 20 MG 24 hr capsule, Take 1 capsule (20 mg total) by mouth every morning., Disp: 30 capsule, Rfl: 0 .  Azelastine-Fluticasone (DYMISTA) 137-50 MCG/ACT SUSP, Place into the nose., Disp: , Rfl:  .  buPROPion (WELLBUTRIN XL) 300 MG 24 hr tablet, Take 1 tablet (300 mg total) by mouth daily., Disp: 90 tablet, Rfl: 3 .  Cyanocobalamin (B-12) 1000 MCG CAPS, Take 1 capsule by mouth daily., Disp: 30 capsule, Rfl: 2 .  dicyclomine (BENTYL) 20 MG tablet, TAKE 1 TABLET EVERY 6 HOURS, Disp: 90 tablet, Rfl: 1 .  escitalopram (LEXAPRO) 10 MG tablet, TAKE 1 TABLET BY MOUTH EVERY DAY, Disp: 90 tablet, Rfl: 3 .  hydrOXYzine (ATARAX/VISTARIL) 50 MG tablet, Take 0.5-2 tablets (25-100 mg total) by mouth at bedtime as needed (insomnia)., Disp: 90  tablet, Rfl: 1 .  loratadine (CLARITIN) 10 MG tablet, Take 10 mg by mouth daily., Disp: , Rfl:  .  Multiple Vitamin (MULTIVITAMIN) tablet, Take 1 tablet by mouth daily., Disp: , Rfl:  .  Norgestimate-Ethinyl Estradiol Triphasic 0.18/0.215/0.25 MG-35 MCG tablet, Take 1 tablet by mouth daily., Disp: 3 Package, Rfl: 3 .  oseltamivir (TAMIFLU) 75 MG capsule, Take 1 capsule (75 mg total) by mouth 2 (two) times daily., Disp: 10 capsule, Rfl: 0 .  predniSONE (DELTASONE) 20 MG tablet, Take 1 tablet (20 mg total) by mouth daily with breakfast., Disp:  5 tablet, Rfl: 0 .  valACYclovir (VALTREX) 1000 MG tablet, Take 1 tablet (1,000 mg total) by mouth 2 (two) times daily., Disp: 180 tablet, Rfl: 1  EXAM:  VITALS per patient if applicable: T 100.1  GENERAL: alert, oriented, appears well and in no acute distress  HEENT: atraumatic, conjunttiva clear, no obvious abnormalities on inspection of external nose and ears, on video visit exam of the oropharynx no sig tonsillar enlargement or exudate, mild erythema of the post oropharynx  NECK: normal movements of the head and neck, no sig pt reported tender nodes  LUNGS: on inspection no signs of respiratory distress, breathing rate appears normal, no obvious gross SOB, gasping or wheezing - occ cough   CV: no obvious cyanosis  MS: moves all visible extremities without noticeable abnormality  PSYCH/NEURO: pleasant and cooperative, no obvious depression or anxiety, speech and thought processing grossly intact  ASSESSMENT AND PLAN:  Discussed the following assessment and plan:  Fever, unspecified fever cause  Cough  Nasal congestion  Body aches  -we discussed possible serious and likely etiologies, options for evaluation and workup, limitations of telemedicine visit vs in person visit, treatment, treatment risks and precautions. Pt prefers to treat via telemedicine empirically rather then risking or undertaking an in person visit at this moment. Query influenza vs VURI vs other. Fully vaccinated for COVID19 making that less likely but not impossible. She denies any asthma or breathing issues at this time and is not requiring her alb. Opted after lengthy discussion for home symptomatic care, alb prn, tamiflu in case influenza given ashtma hx, COVID19 testing which she prefers to pursue on her own, home isolation until neg covid testing and symptoms resolved per influenza recommendations. She agrees to contact us if any concerns or positive COVID test. Patient agrees to seek prompt in person care  if worsening, new symptoms arise, or if is not improving with treatment.   I discussed the assessment and treatment plan with the patient. The patient was provided an opportunity to ask questions and all were answered. The patient agreed with the plan and demonstrated an understanding of the instructions.   The patient was advised to call back or seek an in-person evaluation if the symptoms worsen or if the condition fails to improve as anticipated.   Terressa Koyanagi, DO

## 2019-11-19 ENCOUNTER — Ambulatory Visit: Payer: 59 | Admitting: Psychology

## 2019-11-26 ENCOUNTER — Ambulatory Visit (INDEPENDENT_AMBULATORY_CARE_PROVIDER_SITE_OTHER): Payer: 59 | Admitting: Psychology

## 2019-11-26 DIAGNOSIS — F331 Major depressive disorder, recurrent, moderate: Secondary | ICD-10-CM | POA: Diagnosis not present

## 2019-12-10 ENCOUNTER — Ambulatory Visit (INDEPENDENT_AMBULATORY_CARE_PROVIDER_SITE_OTHER): Payer: 59 | Admitting: Psychology

## 2019-12-10 DIAGNOSIS — F331 Major depressive disorder, recurrent, moderate: Secondary | ICD-10-CM | POA: Diagnosis not present

## 2019-12-31 ENCOUNTER — Ambulatory Visit: Payer: PRIVATE HEALTH INSURANCE | Admitting: Psychology

## 2020-01-07 ENCOUNTER — Ambulatory Visit (INDEPENDENT_AMBULATORY_CARE_PROVIDER_SITE_OTHER): Payer: 59 | Admitting: Psychology

## 2020-01-07 DIAGNOSIS — F331 Major depressive disorder, recurrent, moderate: Secondary | ICD-10-CM | POA: Diagnosis not present

## 2020-01-14 ENCOUNTER — Ambulatory Visit: Payer: PRIVATE HEALTH INSURANCE | Admitting: Psychology

## 2020-01-23 ENCOUNTER — Ambulatory Visit (INDEPENDENT_AMBULATORY_CARE_PROVIDER_SITE_OTHER): Payer: 59 | Admitting: Psychology

## 2020-01-23 DIAGNOSIS — F331 Major depressive disorder, recurrent, moderate: Secondary | ICD-10-CM | POA: Diagnosis not present

## 2020-02-07 ENCOUNTER — Ambulatory Visit (INDEPENDENT_AMBULATORY_CARE_PROVIDER_SITE_OTHER): Payer: 59 | Admitting: Psychology

## 2020-02-07 DIAGNOSIS — F331 Major depressive disorder, recurrent, moderate: Secondary | ICD-10-CM

## 2020-02-11 ENCOUNTER — Other Ambulatory Visit: Payer: Self-pay | Admitting: Family Medicine

## 2020-02-11 ENCOUNTER — Telehealth: Payer: Self-pay | Admitting: Family Medicine

## 2020-02-11 DIAGNOSIS — F339 Major depressive disorder, recurrent, unspecified: Secondary | ICD-10-CM

## 2020-02-11 MED ORDER — NORGESTIM-ETH ESTRAD TRIPHASIC 0.18/0.215/0.25 MG-35 MCG PO TABS: 1.0000 | ORAL_TABLET | Freq: Every day | ORAL | 2 refills | Status: DC

## 2020-02-11 NOTE — Telephone Encounter (Signed)
  LAST APPOINTMENT DATE: 09/26/2019  NEXT APPOINTMENT DATE:@Visit  date not found  MEDICATION: amphetamine-dextroamphetamine (ADDERALL XR) 20 MG 24 hr capsule // Norgestimate-Ethinyl Estradiol Triphasic 0.18/0.215/0.25 MG-35 MCG tablet  PHARMACY: [id number] - [place], [place] - [address] AT [address] OF [facility] [address]

## 2020-02-11 NOTE — Telephone Encounter (Signed)
Birth control sent in.

## 2020-02-12 ENCOUNTER — Telehealth: Payer: Self-pay | Admitting: Family Medicine

## 2020-02-12 DIAGNOSIS — F902 Attention-deficit hyperactivity disorder, combined type: Secondary | ICD-10-CM

## 2020-02-12 MED ORDER — AMPHETAMINE-DEXTROAMPHET ER 20 MG PO CP24: 20.0000 mg | ORAL_CAPSULE | ORAL | 0 refills | Status: DC

## 2020-02-12 NOTE — Telephone Encounter (Signed)
Please advise 

## 2020-02-12 NOTE — Addendum Note (Signed)
Addended by: Ardith Dark on: 02/12/2020 03:14 PM   Modules accepted: Orders

## 2020-02-12 NOTE — Telephone Encounter (Signed)
[name] from [facility] is calling in stating they have yet to receive the patients amphetamine-dextroamphetamine (ADDERALL XR) 20 MG 24 hr capsule asked if it can be re-sent.

## 2020-02-18 ENCOUNTER — Ambulatory Visit: Payer: 59 | Admitting: Psychology

## 2020-02-20 ENCOUNTER — Ambulatory Visit (INDEPENDENT_AMBULATORY_CARE_PROVIDER_SITE_OTHER): Payer: 59 | Admitting: Psychology

## 2020-02-20 DIAGNOSIS — F331 Major depressive disorder, recurrent, moderate: Secondary | ICD-10-CM | POA: Diagnosis not present

## 2020-03-04 ENCOUNTER — Ambulatory Visit (INDEPENDENT_AMBULATORY_CARE_PROVIDER_SITE_OTHER): Payer: 59 | Admitting: Psychology

## 2020-03-04 DIAGNOSIS — F331 Major depressive disorder, recurrent, moderate: Secondary | ICD-10-CM | POA: Diagnosis not present

## 2020-03-18 ENCOUNTER — Telehealth (INDEPENDENT_AMBULATORY_CARE_PROVIDER_SITE_OTHER): Payer: 59 | Admitting: Family Medicine

## 2020-03-18 ENCOUNTER — Encounter: Payer: Self-pay | Admitting: Family Medicine

## 2020-03-18 VITALS — Temp 100.5°F | Ht 63.0 in | Wt 200.0 lb

## 2020-03-18 DIAGNOSIS — J4540 Moderate persistent asthma, uncomplicated: Secondary | ICD-10-CM

## 2020-03-18 DIAGNOSIS — J3089 Other allergic rhinitis: Secondary | ICD-10-CM

## 2020-03-18 DIAGNOSIS — J302 Other seasonal allergic rhinitis: Secondary | ICD-10-CM | POA: Diagnosis not present

## 2020-03-18 MED ORDER — AZITHROMYCIN 250 MG PO TABS: ORAL_TABLET | ORAL | 0 refills | Status: DC

## 2020-03-18 MED ORDER — PREDNISONE 20 MG PO TABS: 20.0000 mg | ORAL_TABLET | Freq: Every day | ORAL | 0 refills | Status: DC

## 2020-03-18 NOTE — Assessment & Plan Note (Signed)
Overall stable.  No signs of flare.

## 2020-03-18 NOTE — Progress Notes (Signed)
   Brianna Wilkinson is a 29 y.o. female who presents today for a virtual office visit.  Assessment/Plan:  New/Acute Problems: Congestion/fever Advised patient to get tested for COVID-19.  She will go to local pharmacy to get this done.  If positive will qualify for antibody infusion due to history of asthma.  Encourage good oral hydration.  Will give school note today.  Will give prednisone burst due to underlying history of asthma and send in a "prescription" for azithromycin.  Recommended Pepto-Bismol and/or Imodium as needed  Chronic Problems Addressed Today: Moderate persistent asthma, uncomplicated Overall stable.  No signs of flare.  Seasonal and perennial allergic rhinitis Continue Claritin and Dymista.    Subjective:  HPI:  Symptoms include congestion, fever to 100.5, sore throat, diarrhea. Symptoms started a day ago. No cough. No shortness of breath. Feels more pressure in sinuses and around her nose. Tried nasal spray and inhaler and claritin which have helped for a little bit. No known sick contacts. Symptoms are worsening.        Objective/Observations  Physical Exam: Gen: NAD, resting comfortably Pulm: Normal work of breathing Neuro: Grossly normal, moves all extremities Psych: Normal affect and thought content  Virtual Visit via Video   I connected with [name] on 03/18/20 at  3:40 PM EDT by a video enabled telemedicine application and verified that I am speaking with the correct person using two identifiers. The limitations of evaluation and management by telemedicine and the availability of in person appointments were discussed. The patient expressed understanding and agreed to proceed.   Patient location: Home Provider location: [place] [employer] [facility] Persons participating in the virtual visit: Myself and Patient     Katina Degree. Jimmey Ralph, MD 03/18/2020 10:39 AM

## 2020-03-18 NOTE — Assessment & Plan Note (Signed)
Continue Claritin and Dymista.

## 2020-03-19 ENCOUNTER — Ambulatory Visit (INDEPENDENT_AMBULATORY_CARE_PROVIDER_SITE_OTHER): Payer: 59 | Admitting: Psychology

## 2020-03-19 DIAGNOSIS — F331 Major depressive disorder, recurrent, moderate: Secondary | ICD-10-CM

## 2020-04-02 ENCOUNTER — Ambulatory Visit (INDEPENDENT_AMBULATORY_CARE_PROVIDER_SITE_OTHER): Payer: 59 | Admitting: Psychology

## 2020-04-02 DIAGNOSIS — F331 Major depressive disorder, recurrent, moderate: Secondary | ICD-10-CM

## 2020-04-16 ENCOUNTER — Ambulatory Visit (INDEPENDENT_AMBULATORY_CARE_PROVIDER_SITE_OTHER): Payer: 59 | Admitting: Psychology

## 2020-04-16 DIAGNOSIS — F331 Major depressive disorder, recurrent, moderate: Secondary | ICD-10-CM | POA: Diagnosis not present

## 2020-04-22 ENCOUNTER — Encounter: Payer: Self-pay | Admitting: Family Medicine

## 2020-04-22 ENCOUNTER — Telehealth (INDEPENDENT_AMBULATORY_CARE_PROVIDER_SITE_OTHER): Payer: 59 | Admitting: Family Medicine

## 2020-04-22 DIAGNOSIS — R110 Nausea: Secondary | ICD-10-CM

## 2020-04-22 DIAGNOSIS — R519 Headache, unspecified: Secondary | ICD-10-CM

## 2020-04-22 DIAGNOSIS — R0982 Postnasal drip: Secondary | ICD-10-CM

## 2020-04-22 MED ORDER — ONDANSETRON 4 MG PO TBDP: 4.0000 mg | ORAL_TABLET | Freq: Three times a day (TID) | ORAL | 0 refills | Status: DC | PRN

## 2020-04-22 NOTE — Patient Instructions (Signed)
   ---------------------------------------------------------------------------------------------------------------------------      SCHOOL SLIP:  Patient Brianna Wilkinson,  [birth year], was seen for a medical visit today, 04/22/20 . Please excuse from school according to the CDC guidelines for a COVID like illness. We advise 10 days minimum from the onset of symptoms (04/19/20) PLUS 1 day of no fever and improved symptoms. Will defer to school for a sooner return IF COVID19 testing is negative and the symptoms have resolved for greater than 24 hours. Advise following CDC guidelines.   Sincerely: E-signature: Dr. Kriste Basque, DO [facility] Ph: [phone]   ------------------------------------------------------------------------------------------------------------------------------     ---------------------------------------------------------------------------------------------------------------------------    WORK SLIP:  Patient Brianna Wilkinson,  [birth year], was seen for a medical visit today, 04/22/20 . Please excuse from work according to the [facility] guidelines for a COVID like illness. We advise 10 days minimum from the onset of symptoms (04/19/20) PLUS 1 day of no fever and improved symptoms. Will defer to employer for a sooner return to work if COVID19 testing is negative and the symptoms have resolved. Advise following CDC guidelines.    Sincerely: E-signature: Dr. Kriste Basque, DO [facility] Ph: [phone]   ------------------------------------------------------------------------------------------------------------------------------   -stay home while sick, and if you have COVID19 please stay home for a full 10 days since the onset of symptoms PLUS one day of no fever and feeling better  -[place] COVID19 testing information: [email or web address] [place] [phone]  -I sent the  medication(s) we discussed to your pharmacy: Meds ordered this encounter  Medications  . ondansetron (ZOFRAN-ODT) 4 MG disintegrating tablet    Sig: Take 1 tablet (4 mg total) by mouth every 8 (eight) hours as needed for nausea or vomiting.    Dispense:  20 tablet    Refill:  0     -can use tylenol or aleve if needed for fevers, aches and pains per instructions  -can use nasal saline a few times per day if nasal congestion, sometime a short course of Afrin nasal spray for 3 days can help as well  -stay hydrated, drink plenty of fluids and eat small healthy meals - avoid dairy  -can take 1000 IU Vit D3 and Vit C lozenges per instructions  -follow up with your doctor in 2-3 days unless improving and feeling better  I hope you are feeling better soon! Seek in-person care or a follow up telemedicine visit promptly if your symptoms worsen, new concerns arise or you are not improving as expected. Call 911 if severe symptoms.

## 2020-04-22 NOTE — Progress Notes (Signed)
Virtual Visit via Video Note  I connected with Brianna Wilkinson  on 04/22/20 at 12:20 PM EDT by a video enabled telemedicine application and verified that I am speaking with the correct person using two identifiers.  Location patient: home, [place] Location provider:work or home office Persons participating in the virtual visit: patient, provider  I discussed the limitations of evaluation and management by telemedicine and the availability of in person appointments. The patient expressed understanding and agreed to proceed.   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  Past Medical History:  Diagnosis Date  . Angio-edema   . Anxiety   . Asthma   . Depression   . Eczema   . IBS (irritable bowel syndrome)     Past Surgical History:  Procedure Laterality Date  . WISDOM TOOTH EXTRACTION       Current Outpatient Medications:  .  albuterol (PROVENTIL HFA;VENTOLIN HFA) 108 (90 Base) MCG/ACT inhaler, Inhale 2 puffs into the lungs every 4 (four) hours as needed for wheezing., Disp: 1 Inhaler, Rfl: 0 .  amphetamine-dextroamphetamine (ADDERALL XR) 20 MG 24 hr capsule, Take 1 capsule (20 mg total) by mouth every morning., Disp: 30 capsule, Rfl: 0 .  Azelastine-Fluticasone (DYMISTA)  137-50 MCG/ACT SUSP, Place into the nose., Disp: , Rfl:  .  azithromycin (ZITHROMAX) 250 MG tablet, Take 2 tabs day 1, then 1 tab daily, Disp: 6 each, Rfl: 0 .  buPROPion (WELLBUTRIN XL) 300 MG 24 hr tablet, Take 1 tablet (300 mg total) by mouth daily., Disp: 90 tablet, Rfl: 3 .  Cyanocobalamin (B-12) 1000 MCG CAPS, Take 1 capsule by mouth daily., Disp: 30 capsule, Rfl: 2 .  dicyclomine (BENTYL) 20 MG tablet, TAKE 1 TABLET EVERY 6 HOURS, Disp: 90 tablet, Rfl: 1 .  escitalopram (LEXAPRO) 10 MG tablet, TAKE 1 TABLET BY MOUTH EVERY DAY, Disp: 90 tablet, Rfl: 3 .  hydrOXYzine (ATARAX/VISTARIL) 50 MG tablet, Take 0.5-2 tablets (25-100 mg total) by mouth at bedtime as needed (insomnia)., Disp: 90 tablet, Rfl: 1 .  loratadine (CLARITIN) 10 MG tablet, Take 10 mg by mouth daily., Disp: , Rfl:  .  Multiple Vitamin (MULTIVITAMIN) tablet, Take 1 tablet by mouth daily., Disp: , Rfl:  .  Norgestimate-Ethinyl Estradiol Triphasic 0.18/0.215/0.25 MG-35 MCG tablet, Take 1 tablet by mouth daily., Disp: 30 tablet, Rfl: 2 .  predniSONE (DELTASONE) 20 MG tablet, Take 1 tablet (20 mg total) by mouth daily with breakfast., Disp: 5 tablet, Rfl: 0 .  valACYclovir (VALTREX) 1000 MG tablet, Take 1 tablet (1,000 mg total) by mouth 2 (two) times daily., Disp: 180 tablet, Rfl: 1 .  ondansetron (ZOFRAN-ODT) 4 MG disintegrating tablet, Take 1 tablet (4 mg total) by mouth every 8 (eight) hours as needed for nausea or vomiting., Disp: 20 tablet, Rfl: 0  EXAM:  VITALS per patient if applicable:  GENERAL: alert,  oriented, appears well and in no acute distress  HEENT: atraumatic, conjunttiva clear, no obvious abnormalities on inspection of external nose and ears  NECK: normal movements of the head and neck  LUNGS: on inspection no signs of respiratory distress, breathing rate appears normal, no obvious gross SOB, gasping or wheezing, coughed several times during the visit  CV: no obvious cyanosis  MS: moves all visible  extremities without noticeable abnormality  PSYCH/NEURO: pleasant and cooperative, no obvious depression or anxiety, speech and thought processing grossly intact  ASSESSMENT AND PLAN:  Discussed the following assessment and plan:  Nausea  Nonintractable headache, unspecified chronicity pattern, unspecified headache type  PND (post-nasal drip)  -we discussed possible serious and likely etiologies, options for evaluation and workup, limitations of telemedicine visit vs in person visit, treatment, treatment risks and precautions. Pt prefers to treat via telemedicine empirically rather than in person at this moment.  Query possible viral illness, breakthrough COVID-19, versus other.  Opted for trial of symptomatic care with Zofran, Rx sent to pharmacy, and other over-the-counter options summarized in patient instructions.  Discussed testing for COVID-19.  Advised to stay home while sick, and for full 10 days if Covid testing is positive.  She denies any asthma symptoms at this time.  Does seem to have a mild cough.  Discussed potential complications and precautions. Work/School slipped offered: provided in patient instructions Scheduled follow up with PCP offered: Patient does prefer to have a scheduled follow-up in a few days with her PCP office, sent message to schedulers to assist and advised patient to contact PCP office to schedule if does not receive call back in next 24 hours. Advised to seek prompt follow up telemedicine visit or in person care if worsening, new symptoms arise, or if is not improving with treatment. Did let this patient know that I only do telemedicine on Tuesdays and Thursdays for [place]. Advised to schedule follow up visit with PCP or [facility] if any further questions or concerns to avoid delays in care.   I discussed the assessment and treatment plan with the patient. The patient was provided an opportunity to ask questions and all were answered. The patient agreed with the  plan and demonstrated an understanding of the instructions.     Terressa Koyanagi, DO

## 2020-04-25 ENCOUNTER — Telehealth (INDEPENDENT_AMBULATORY_CARE_PROVIDER_SITE_OTHER): Payer: 59 | Admitting: Family Medicine

## 2020-04-25 ENCOUNTER — Encounter: Payer: Self-pay | Admitting: Family Medicine

## 2020-04-25 VITALS — Temp 98.5°F | Ht 63.0 in | Wt 200.0 lb

## 2020-04-25 DIAGNOSIS — J302 Other seasonal allergic rhinitis: Secondary | ICD-10-CM | POA: Diagnosis not present

## 2020-04-25 DIAGNOSIS — J3089 Other allergic rhinitis: Secondary | ICD-10-CM | POA: Diagnosis not present

## 2020-04-25 DIAGNOSIS — K589 Irritable bowel syndrome without diarrhea: Secondary | ICD-10-CM

## 2020-04-25 DIAGNOSIS — J4540 Moderate persistent asthma, uncomplicated: Secondary | ICD-10-CM

## 2020-04-25 MED ORDER — AZITHROMYCIN 250 MG PO TABS: ORAL_TABLET | ORAL | 0 refills | Status: DC

## 2020-04-25 MED ORDER — ONDANSETRON 8 MG PO TBDP: 8.0000 mg | ORAL_TABLET | Freq: Three times a day (TID) | ORAL | 0 refills | Status: DC | PRN

## 2020-04-25 NOTE — Progress Notes (Signed)
   Brianna Wilkinson is a 29 y.o. female who presents today for a virtual office visit.  Assessment/Plan:  New/Acute Problems: Cough / Nausea Likely viral URI.  Given length of symptoms and worsening concern for sinusitis/bacterial superinfection.  Will give "pocket prescription" for azithromycin instruction not start unless symptoms do not improve over the next couple days.  We will also refill her prescription for Zofran and increase dose to 8 mg.  Discussed reasons return to care.  Encourage good oral hydration.  Chronic Problems Addressed Today: Irritable bowel syndrome Overall stable.  Will continue Bentyl 20 mg daily and gastroenterology per patient request.  Moderate persistent asthma, uncomplicated No signs of acute flare despite URI. Does not need prednisone at this point.     Subjective:  HPI:  Patient here with cough, nausea, fatigue, and nasal drainage for the past week or so.  Had virtual visit 3 days ago with another provider.  Covid test was negative.  She has been using Zofran which seems to help.  She also been using Dymista which seems to help.  She has noticed more swollen lymph nodes in her neck.  She is worried about possible sinus infection.  Symptoms seem to be worsening.       Objective/Observations  Physical Exam: Gen: NAD, resting comfortably Pulm: Normal work of breathing Neuro: Grossly normal, moves all extremities Psych: Normal affect and thought content  Virtual Visit via Video   I connected with [name] on 04/25/20 at  3:40 PM EDT by a video enabled telemedicine application and verified that I am speaking with the correct person using two identifiers. The limitations of evaluation and management by telemedicine and the availability of in person appointments were discussed. The patient expressed understanding and agreed to proceed.   Patient location: Home Provider location:  [place] [employer] Persons participating in the  virtual visit: Myself and Patient     Katina Degree. Jimmey Ralph, MD 04/25/2020 4:28 PM

## 2020-04-25 NOTE — Assessment & Plan Note (Signed)
No signs of acute flare despite URI. Does not need prednisone at this point.

## 2020-04-25 NOTE — Assessment & Plan Note (Signed)
Overall stable.  Will continue Bentyl 20 mg daily and gastroenterology per patient request.

## 2020-04-30 ENCOUNTER — Ambulatory Visit (INDEPENDENT_AMBULATORY_CARE_PROVIDER_SITE_OTHER): Payer: 59 | Admitting: Psychology

## 2020-04-30 DIAGNOSIS — F331 Major depressive disorder, recurrent, moderate: Secondary | ICD-10-CM | POA: Diagnosis not present

## 2020-05-14 ENCOUNTER — Ambulatory Visit (INDEPENDENT_AMBULATORY_CARE_PROVIDER_SITE_OTHER): Payer: 59 | Admitting: Psychology

## 2020-05-14 DIAGNOSIS — F331 Major depressive disorder, recurrent, moderate: Secondary | ICD-10-CM | POA: Diagnosis not present

## 2020-05-22 ENCOUNTER — Other Ambulatory Visit: Payer: Self-pay | Admitting: Family Medicine

## 2020-05-28 ENCOUNTER — Ambulatory Visit (INDEPENDENT_AMBULATORY_CARE_PROVIDER_SITE_OTHER): Payer: 59 | Admitting: Psychology

## 2020-05-28 DIAGNOSIS — F330 Major depressive disorder, recurrent, mild: Secondary | ICD-10-CM | POA: Diagnosis not present

## 2020-06-03 ENCOUNTER — Telehealth (INDEPENDENT_AMBULATORY_CARE_PROVIDER_SITE_OTHER): Payer: 59 | Admitting: Family Medicine

## 2020-06-03 ENCOUNTER — Encounter: Payer: Self-pay | Admitting: Family Medicine

## 2020-06-03 VITALS — Ht 63.0 in | Wt 210.0 lb

## 2020-06-03 DIAGNOSIS — F902 Attention-deficit hyperactivity disorder, combined type: Secondary | ICD-10-CM | POA: Diagnosis not present

## 2020-06-03 DIAGNOSIS — F339 Major depressive disorder, recurrent, unspecified: Secondary | ICD-10-CM

## 2020-06-03 DIAGNOSIS — Z3009 Encounter for other general counseling and advice on contraception: Secondary | ICD-10-CM

## 2020-06-03 MED ORDER — NORELGESTROMIN-ETH ESTRADIOL 150-35 MCG/24HR TD PTWK: 1.0000 | MEDICATED_PATCH | TRANSDERMAL | 12 refills | Status: DC

## 2020-06-03 MED ORDER — BUPROPION HCL ER (SR) 100 MG PO TB12: 100.0000 mg | ORAL_TABLET | Freq: Two times a day (BID) | ORAL | 3 refills | Status: DC

## 2020-06-03 MED ORDER — AMPHETAMINE-DEXTROAMPHET ER 20 MG PO CP24: 20.0000 mg | ORAL_CAPSULE | ORAL | 0 refills | Status: DC

## 2020-06-03 NOTE — Assessment & Plan Note (Signed)
She has not been taking meds as prescribed.  She has been following with therapist.  Will restart Wellbutrin at 100 mg twice daily.  She will check with me in a couple weeks via MyChart.  May consider addition of SSRI depending on response Wellbutrin.

## 2020-06-03 NOTE — Assessment & Plan Note (Signed)
Database with no red flags.  Continue Adderall 20 mg daily as needed.  Follow-up 6 months.

## 2020-06-03 NOTE — Progress Notes (Signed)
   Brianna Wilkinson is a 29 y.o. female who presents today for a virtual office visit.  Assessment/Plan:  Chronic Problems Addressed Today: Birth control counseling Discussed other options aside from OCP. We will send in Ortho Evra.  She has been on this in the past and has done well with it.    Attention deficit hyperactivity disorder (ADHD), combined type Database with no red flags.  Continue Adderall 20 mg daily as needed.  Follow-up 6 months.  Depression, recurrent (HCC) She has not been taking meds as prescribed.  She has been following with therapist.  Will restart Wellbutrin at 100 mg twice daily.  She will check with me in a couple weeks via MyChart.  May consider addition of SSRI depending on response Wellbutrin.    Subjective:  HPI:  See A/p for status of chronic conditions.  Patient here today for follow-up.  She admits she has not been taking her medications and like to restart.  Adderall helps well when she remembers to take it.  No reported side effects.  She has been on Wellbutrin and Lexapro in the past for depression.  She would like Wellbutrin worked the best.  She did not find the 150 mg dose particularly effective with up to 300 mg dose was too strong.  She would also like to be restarted on birth control patch.  She has been taking her OCPs but occasionally forgets.  Patch is easier for her to take.  She is not interested in IUD at this point.  She potentially could be interested in Nexplanon at some point but does not want to do this currently.       Objective/Observations  Physical Exam: Gen: NAD, resting comfortably Pulm: Normal work of breathing Neuro: Grossly normal, moves all extremities Psych: Normal affect and thought content  Virtual Visit via Video   I connected with Brianna Wilkinson on 06/03/20 at  4:00 PM EST by a video enabled telemedicine application and verified that I am speaking with the correct person using two identifiers. The limitations  of evaluation and management by telemedicine and the availability of in person appointments were discussed. The patient expressed understanding and agreed to proceed.   Patient location: Home Provider location: [place] [employer] Persons participating in the virtual visit: Myself and Patient     Katina Degree. Jimmey Ralph, MD 06/03/2020 3:51 PM

## 2020-06-03 NOTE — Assessment & Plan Note (Signed)
Discussed other options aside from OCP. We will send in Ortho Evra.  She has been on this in the past and has done well with it.

## 2020-06-04 ENCOUNTER — Encounter: Payer: Self-pay | Admitting: Physician Assistant

## 2020-06-10 ENCOUNTER — Ambulatory Visit (INDEPENDENT_AMBULATORY_CARE_PROVIDER_SITE_OTHER): Payer: 59 | Admitting: Psychology

## 2020-06-10 DIAGNOSIS — F331 Major depressive disorder, recurrent, moderate: Secondary | ICD-10-CM

## 2020-06-24 ENCOUNTER — Ambulatory Visit: Payer: 59 | Admitting: Physician Assistant

## 2020-06-25 ENCOUNTER — Ambulatory Visit (INDEPENDENT_AMBULATORY_CARE_PROVIDER_SITE_OTHER): Payer: 59 | Admitting: Psychology

## 2020-06-25 DIAGNOSIS — F331 Major depressive disorder, recurrent, moderate: Secondary | ICD-10-CM | POA: Diagnosis not present

## 2020-07-08 ENCOUNTER — Other Ambulatory Visit: Payer: Self-pay | Admitting: Family Medicine

## 2020-07-09 ENCOUNTER — Ambulatory Visit (INDEPENDENT_AMBULATORY_CARE_PROVIDER_SITE_OTHER): Payer: 59 | Admitting: Psychology

## 2020-07-09 DIAGNOSIS — F331 Major depressive disorder, recurrent, moderate: Secondary | ICD-10-CM | POA: Diagnosis not present

## 2020-07-23 ENCOUNTER — Ambulatory Visit (INDEPENDENT_AMBULATORY_CARE_PROVIDER_SITE_OTHER): Payer: Self-pay | Admitting: Psychology

## 2020-07-23 DIAGNOSIS — F331 Major depressive disorder, recurrent, moderate: Secondary | ICD-10-CM

## 2020-07-31 ENCOUNTER — Other Ambulatory Visit: Payer: Self-pay | Admitting: Family Medicine

## 2020-07-31 DIAGNOSIS — F902 Attention-deficit hyperactivity disorder, combined type: Secondary | ICD-10-CM

## 2020-07-31 MED ORDER — BUPROPION HCL ER (SR) 100 MG PO TB12
100.0000 mg | ORAL_TABLET | Freq: Two times a day (BID) | ORAL | 0 refills | Status: DC
Start: 2020-07-31 — End: 2020-11-03

## 2020-07-31 MED ORDER — AMPHETAMINE-DEXTROAMPHET ER 20 MG PO CP24: 20.0000 mg | ORAL_CAPSULE | ORAL | 0 refills | Status: DC

## 2020-08-06 ENCOUNTER — Ambulatory Visit (INDEPENDENT_AMBULATORY_CARE_PROVIDER_SITE_OTHER): Payer: 59 | Admitting: Psychology

## 2020-08-06 DIAGNOSIS — F331 Major depressive disorder, recurrent, moderate: Secondary | ICD-10-CM

## 2020-08-20 ENCOUNTER — Ambulatory Visit (INDEPENDENT_AMBULATORY_CARE_PROVIDER_SITE_OTHER): Payer: 59 | Admitting: Psychology

## 2020-08-20 DIAGNOSIS — F331 Major depressive disorder, recurrent, moderate: Secondary | ICD-10-CM

## 2020-09-03 ENCOUNTER — Ambulatory Visit (INDEPENDENT_AMBULATORY_CARE_PROVIDER_SITE_OTHER): Payer: 59 | Admitting: Psychology

## 2020-09-03 DIAGNOSIS — F331 Major depressive disorder, recurrent, moderate: Secondary | ICD-10-CM

## 2020-09-16 ENCOUNTER — Encounter: Payer: Self-pay | Admitting: Family Medicine

## 2020-09-16 ENCOUNTER — Telehealth (INDEPENDENT_AMBULATORY_CARE_PROVIDER_SITE_OTHER): Payer: Self-pay | Admitting: Family Medicine

## 2020-09-16 DIAGNOSIS — H5713 Ocular pain, bilateral: Secondary | ICD-10-CM

## 2020-09-16 DIAGNOSIS — R197 Diarrhea, unspecified: Secondary | ICD-10-CM

## 2020-09-16 DIAGNOSIS — H539 Unspecified visual disturbance: Secondary | ICD-10-CM

## 2020-09-16 DIAGNOSIS — R519 Headache, unspecified: Secondary | ICD-10-CM

## 2020-09-16 NOTE — Progress Notes (Signed)
Virtual Visit via Video Note  I connected with Brianna Wilkinson  on 09/16/20 at 10:20 AM EST by a video enabled telemedicine application and verified that I am speaking with the correct person using two identifiers.  Location patient: home, [place] Location provider:work or home office Persons participating in the virtual visit: patient, provider  I discussed the limitations of evaluation and management by telemedicine and the availability of in person appointments. The patient expressed understanding and agreed to proceed.   HPI:  Acute telemedicine visit for flu like symptoms: -Onset: 3 days ago -Symptoms include: feeling more tired than usual, headache, both eyes are hurting - hurts when moves her eyes, yesterday "lost vision" for a few minutes (could see a little but was very blurry - felt like she couldn't see to drive), upset stomach, diarrhea (watery stools 3 times today), some nasal congestion - but this is normal for her, reports feels very weak -has been working on the computer more - -Denies: fever, vomiting, numbness, known sick contacts, tick bites -Pertinent past medical history: reports never gets headaches -COVID-19 vaccine status: fully vaccinated for covid and had booster; vaccinated   ROS: See pertinent positives and negatives per HPI.  Past Medical History:  Diagnosis Date  . Angio-edema   . Anxiety   . Asthma   . Depression   . Eczema   . IBS (irritable bowel syndrome)     Past Surgical History:  Procedure Laterality Date  . WISDOM TOOTH EXTRACTION       Current Outpatient Medications:  .  albuterol (PROVENTIL HFA;VENTOLIN HFA) 108 (90 Base) MCG/ACT inhaler, Inhale 2 puffs into the lungs every 4 (four) hours as needed for wheezing. (Patient not taking: Reported on 06/03/2020), Disp: 1 Inhaler, Rfl: 0 .  amphetamine-dextroamphetamine (ADDERALL XR) 20 MG 24 hr capsule, Take 1 capsule (20 mg total) by mouth every morning., Disp: 30 capsule, Rfl: 0 .   Azelastine-Fluticasone (DYMISTA) 137-50 MCG/ACT SUSP, Place into the nose., Disp: , Rfl:  .  buPROPion (WELLBUTRIN SR) 100 MG 12 hr tablet, Take 1 tablet (100 mg total) by mouth 2 (two) times daily., Disp: 180 tablet, Rfl: 0 .  Cyanocobalamin (B-12) 1000 MCG CAPS, Take 1 capsule by mouth daily., Disp: 30 capsule, Rfl: 2 .  dicyclomine (BENTYL) 20 MG tablet, TAKE 1 TABLET EVERY 6 HOURS, Disp: 90 tablet, Rfl: 1 .  hydrOXYzine (ATARAX/VISTARIL) 50 MG tablet, Take 0.5-2 tablets (25-100 mg total) by mouth at bedtime as needed (insomnia)., Disp: 90 tablet, Rfl: 1 .  loratadine (CLARITIN) 10 MG tablet, Take 10 mg by mouth daily., Disp: , Rfl:  .  Multiple Vitamin (MULTIVITAMIN) tablet, Take 1 tablet by mouth daily., Disp: , Rfl:  .  norelgestromin-ethinyl estradiol (ORTHO EVRA) 150-35 MCG/24HR transdermal patch, Place 1 patch onto the skin once a week., Disp: 3 patch, Rfl: 12 .  ondansetron (ZOFRAN-ODT) 8 MG disintegrating tablet, Take 1 tablet (8 mg total) by mouth every 8 (eight) hours as needed for nausea or vomiting., Disp: 30 tablet, Rfl: 0 .  valACYclovir (VALTREX) 1000 MG tablet, Take 1 tablet (1,000 mg total) by mouth 2 (two) times daily., Disp: 180 tablet, Rfl: 1  EXAM:  VITALS per patient if applicable:denies fever currently  GENERAL: alert, oriented, appears well and in no acute distress  HEENT: atraumatic, conjunttiva clear, no obvious abnormalities on inspection of external nose and ears  NECK: normal movements of the head and neck  LUNGS: on inspection no signs of respiratory distress, breathing rate appears normal, no obvious  gross SOB, gasping or wheezing  CV: no obvious cyanosis  MS: moves all visible extremities without noticeable abnormality  PSYCH/NEURO: pleasant and cooperative, no obvious depression or anxiety, speech and thought processing grossly intact  ASSESSMENT AND PLAN:  Discussed the following assessment and plan:  Nonintractable headache, unspecified  chronicity pattern, unspecified headache type  Visual disturbance  Discomfort of both eyes  Diarrhea, unspecified type  -we discussed possible serious and likely etiologies, options for evaluation and workup, limitations of telemedicine visit vs in person visit, treatment, treatment risks and precautions. Possible viral infection with eyestrain, however, given reported symptoms, particularly the episode of visual loss or disturbance, advised prompt in-person evaluation is needed. Discussed options for in person care and she prefers to go by private vehicle. Reports her boyfriend can take her. Currently her vision is restored. She is considering going to a nearby  urgent care or to [facility] med center. Did let her know that the ER at [facility] med center would be a better choice, as they have no imaging available, if that is needed. Work/School slipped offered: provided in patient instructions for today for further evaluation per her request.    I discussed the assessment and treatment plan with the patient. The patient was provided an opportunity to ask questions and all were answered. The patient agreed with the plan and demonstrated an understanding of the instructions.     Terressa Koyanagi, DO

## 2020-09-16 NOTE — Patient Instructions (Addendum)
  Please seek in person care promptly today as we discussed. If you have any return of visual disturbances or vision loss or symptoms are worsening, or you feel you cannot transport yourself to the urgent care or hospital for evaluation, please call 911.   ---------------------------------------------------------------------------------------------------------------------------    WORK SLIP:  Patient Brianna Wilkinson,  [id number], was seen for a medical visit today, 09/16/20 . Please excuse from work for today for a medical illness as have advised evaluation at a higher level of care today. Additional work restrictions may be required by evaluating physician at higher level of care.   Sincerely: E-signature: Dr. Kriste Basque, DO [facility] Ph: [phone]   ------------------------------------------------------------------------------------------------------------------------------     It was nice to meet you today. I help [place] out with telemedicine visits on Tuesdays and Thursdays and am available for visits on those days. If you have any concerns or questions following this visit please schedule a follow up visit with your Primary Care doctor or seek care at a local urgent care clinic to avoid delays in care.

## 2020-09-17 ENCOUNTER — Ambulatory Visit (INDEPENDENT_AMBULATORY_CARE_PROVIDER_SITE_OTHER): Payer: 59 | Admitting: Psychology

## 2020-09-17 DIAGNOSIS — F331 Major depressive disorder, recurrent, moderate: Secondary | ICD-10-CM

## 2020-10-01 ENCOUNTER — Ambulatory Visit (INDEPENDENT_AMBULATORY_CARE_PROVIDER_SITE_OTHER): Payer: 59 | Admitting: Psychology

## 2020-10-01 DIAGNOSIS — F331 Major depressive disorder, recurrent, moderate: Secondary | ICD-10-CM | POA: Diagnosis not present

## 2020-10-15 ENCOUNTER — Other Ambulatory Visit: Payer: Self-pay | Admitting: Family Medicine

## 2020-10-15 ENCOUNTER — Encounter: Payer: Self-pay | Admitting: Family Medicine

## 2020-10-15 ENCOUNTER — Ambulatory Visit: Payer: 59 | Admitting: Psychology

## 2020-10-15 DIAGNOSIS — F902 Attention-deficit hyperactivity disorder, combined type: Secondary | ICD-10-CM

## 2020-10-16 MED ORDER — AMPHETAMINE-DEXTROAMPHET ER 20 MG PO CP24: 20.0000 mg | ORAL_CAPSULE | ORAL | 0 refills | Status: DC

## 2020-10-22 ENCOUNTER — Other Ambulatory Visit: Payer: Self-pay | Admitting: Family Medicine

## 2020-10-22 DIAGNOSIS — F339 Major depressive disorder, recurrent, unspecified: Secondary | ICD-10-CM

## 2020-10-22 NOTE — Telephone Encounter (Signed)
Pharmacy note  The original prescription was discontinued on 06/03/2020 by Ardith Dark, MD. Renewing this prescription may not be appropriate.

## 2020-10-29 ENCOUNTER — Ambulatory Visit (INDEPENDENT_AMBULATORY_CARE_PROVIDER_SITE_OTHER): Payer: 59 | Admitting: Psychology

## 2020-10-29 DIAGNOSIS — F331 Major depressive disorder, recurrent, moderate: Secondary | ICD-10-CM

## 2020-11-03 ENCOUNTER — Ambulatory Visit (INDEPENDENT_AMBULATORY_CARE_PROVIDER_SITE_OTHER): Payer: 59 | Admitting: Family Medicine

## 2020-11-03 ENCOUNTER — Other Ambulatory Visit: Payer: Self-pay

## 2020-11-03 VITALS — BP 108/68 | HR 91 | Temp 97.5°F | Ht 63.0 in | Wt 197.2 lb

## 2020-11-03 DIAGNOSIS — K589 Irritable bowel syndrome without diarrhea: Secondary | ICD-10-CM

## 2020-11-03 DIAGNOSIS — Z3009 Encounter for other general counseling and advice on contraception: Secondary | ICD-10-CM | POA: Diagnosis not present

## 2020-11-03 DIAGNOSIS — N946 Dysmenorrhea, unspecified: Secondary | ICD-10-CM

## 2020-11-03 DIAGNOSIS — F339 Major depressive disorder, recurrent, unspecified: Secondary | ICD-10-CM

## 2020-11-03 DIAGNOSIS — F902 Attention-deficit hyperactivity disorder, combined type: Secondary | ICD-10-CM

## 2020-11-03 MED ORDER — DICYCLOMINE HCL 20 MG PO TABS: ORAL_TABLET | ORAL | 1 refills | Status: AC

## 2020-11-03 MED ORDER — BUPROPION HCL ER (SR) 100 MG PO TB12: 100.0000 mg | ORAL_TABLET | Freq: Two times a day (BID) | ORAL | 0 refills | Status: DC

## 2020-11-03 MED ORDER — NAPROXEN 500 MG PO TABS: 500.0000 mg | ORAL_TABLET | Freq: Two times a day (BID) | ORAL | 0 refills | Status: DC

## 2020-11-03 MED ORDER — NORETHIN-ETH ESTRAD TRIPHASIC 0.5/1/0.5-35 MG-MCG PO TABS: 1.0000 | ORAL_TABLET | Freq: Every day | ORAL | 11 refills | Status: DC

## 2020-11-03 NOTE — Progress Notes (Signed)
   Brianna Wilkinson is a 30 y.o. female who presents today for an office visit.  Assessment/Plan:  Chronic Problems Addressed Today: Dysmenorrhea We will restart OCP today.  Also start naproxen 500 mg twice daily as needed for cramps.  Birth control counseling She is not interested in other options.  Will refill OCP.  Attention deficit hyperactivity disorder (ADHD), combined type Doing well with Adderall 20 mg daily.  Irritable bowel syndrome Will refill Bentyl today.  She will follow-up with GI soon.  Depression, recurrent (HCC) Doing well on Wellbutrin 300 mg daily.     Subjective:  HPI:  See A/P.  She is doing well today.  She has concerned about dysmenorrhea and fertility.  She recently had fertility testing done.  She does not try to get pregnant but wants to make sure that she can get pregnant in the future.  Size irregular periods.  Sometimes has painful cramps associated with menstrual cycles.  They occur at regular intervals and last throughout 4 days at a time.  She is on birth control patch in the past but insurance is no longer covering this.  She would like to go back to OCPs.  She is not interested in other birth control methods.       Objective:  Physical Exam: BP 108/68   Pulse 91   Temp (!) 97.5 F (36.4 C)   Ht 5\' 3"  (1.6 m)   Wt 197 lb 3.2 oz (89.4 kg)   LMP 10/30/2020   SpO2 99%   BMI 34.93 kg/m   Gen: No acute distress, resting comfortably Neuro: Grossly normal, moves all extremities Psych: Normal affect and thought content      Grant Henkes M. 11/01/2020, MD 11/03/2020 2:52 PM

## 2020-11-03 NOTE — Assessment & Plan Note (Signed)
We will restart OCP today.  Also start naproxen 500 mg twice daily as needed for cramps.

## 2020-11-03 NOTE — Assessment & Plan Note (Signed)
She is not interested in other options.  Will refill OCP.

## 2020-11-03 NOTE — Assessment & Plan Note (Signed)
Doing well on Wellbutrin 300 mg daily. 

## 2020-11-03 NOTE — Assessment & Plan Note (Signed)
Doing well with Adderall 20 mg daily.

## 2020-11-03 NOTE — Assessment & Plan Note (Signed)
Will refill Bentyl today.  She will follow-up with GI soon.

## 2020-11-03 NOTE — Patient Instructions (Signed)
It was very nice to see you today!  I will refill your medications.  I do not see any reason why you cannot become pregnant based on your blood work.  We will see back in a few months.  Please come back to see me sooner if needed.  Take care, Dr Jimmey Ralph  PLEASE NOTE:  If you had any lab tests please let us know if you have not heard back within a few days. You may see your results on mychart before we have a chance to review them but we will give you a call once they are reviewed by [place]. If we ordered any referrals today, please let us know if you have not heard from their office within the next week.   Please try these tips to maintain a healthy lifestyle:   Eat at least 3 REAL meals and 1-2 snacks per day.  Aim for no more than 5 hours between eating.  If you eat breakfast, please do so within one hour of getting up.    Each meal should contain half fruits/vegetables, one quarter protein, and one quarter carbs (no bigger than a computer mouse)   Cut down on sweet beverages. This includes juice, soda, and sweet tea.     Drink at least 1 glass of water with each meal and aim for at least 8 glasses per day   Exercise at least 150 minutes every week.

## 2020-11-11 IMAGING — CR DG CHEST 2V
2 series · 2 of 2 positions shown · non-contrast
Comparison: Chest radiograph dated 04/21/2014

CLINICAL DATA: 26-year-old female with cough.

EXAM:
CHEST - 2 VIEW

[w chest pa]
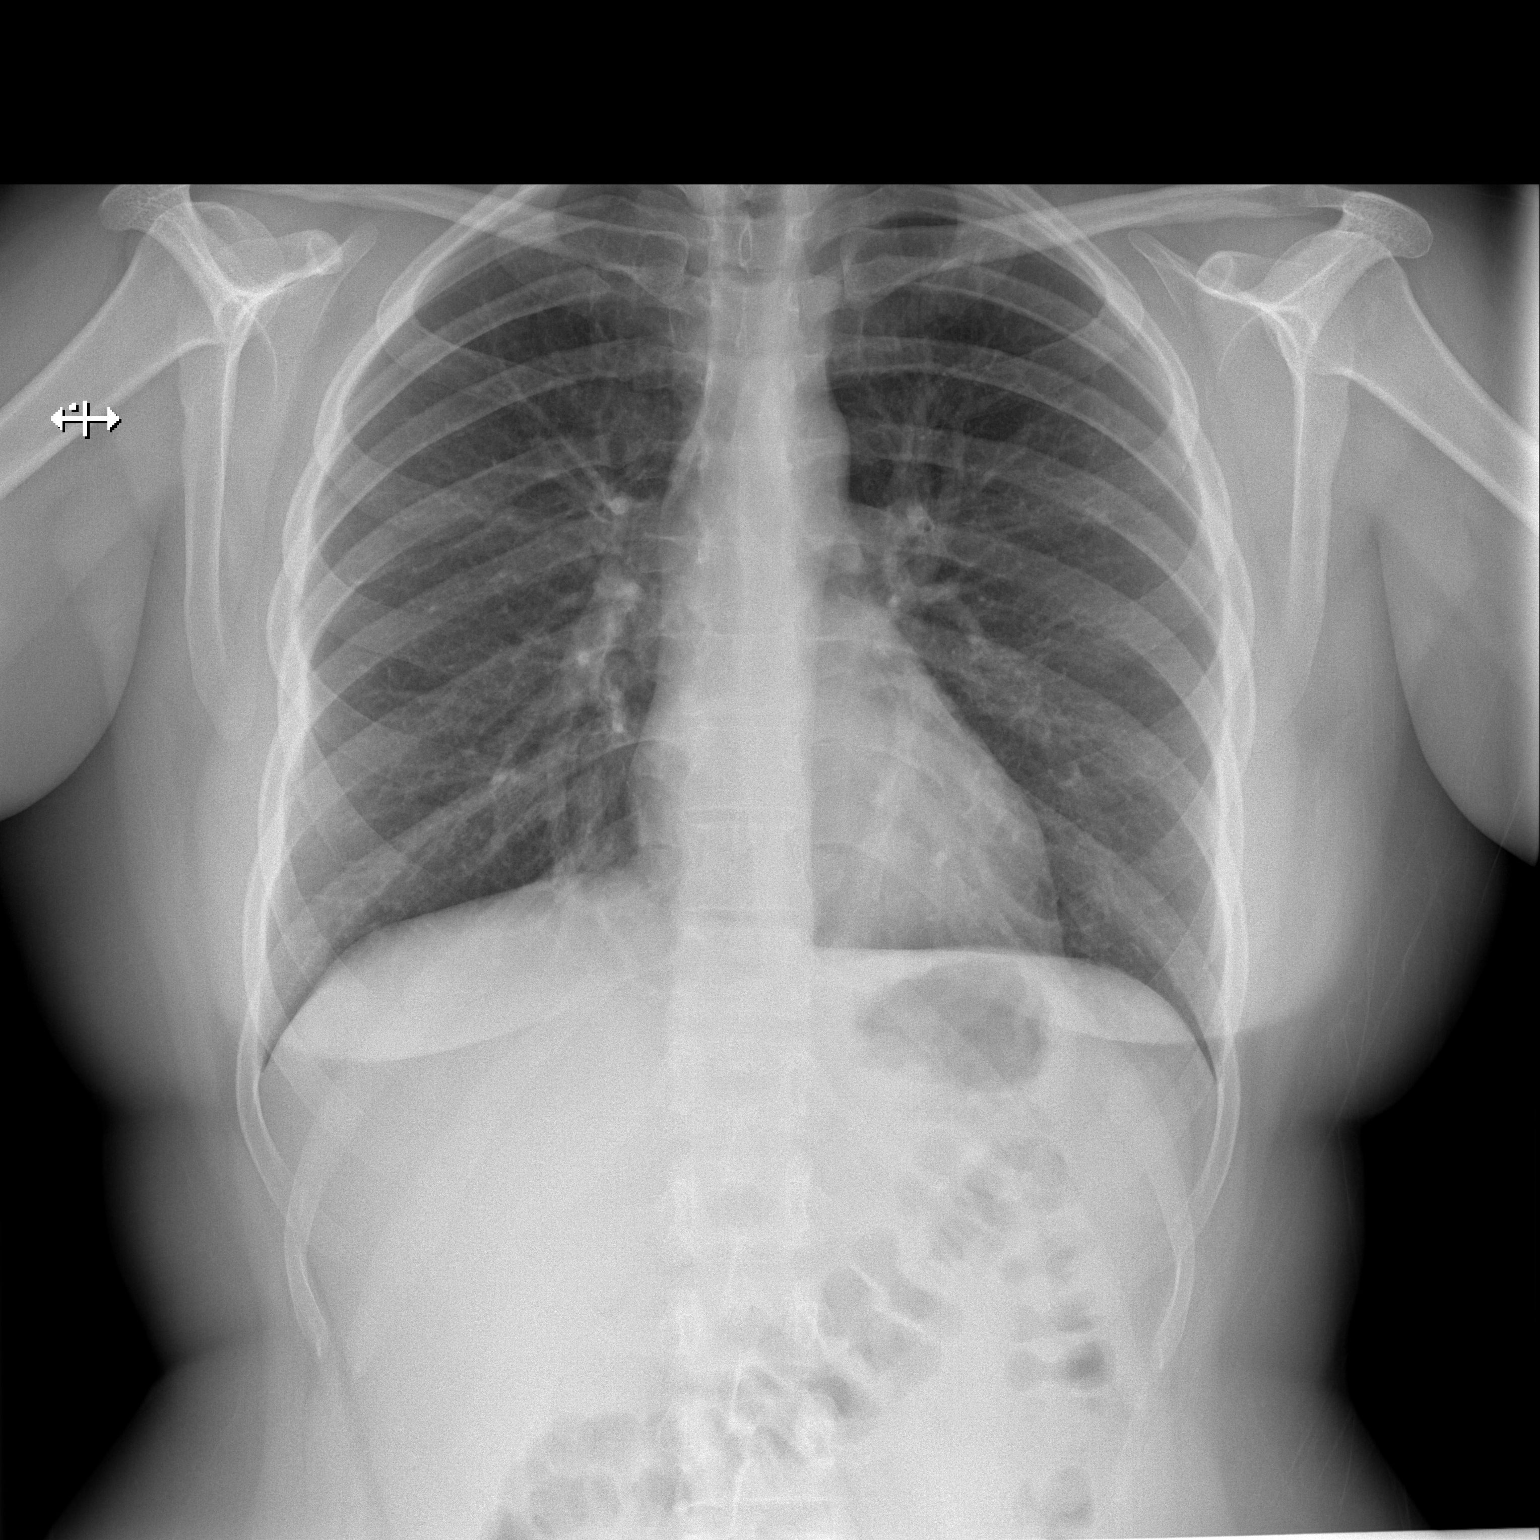

[w chest lat]
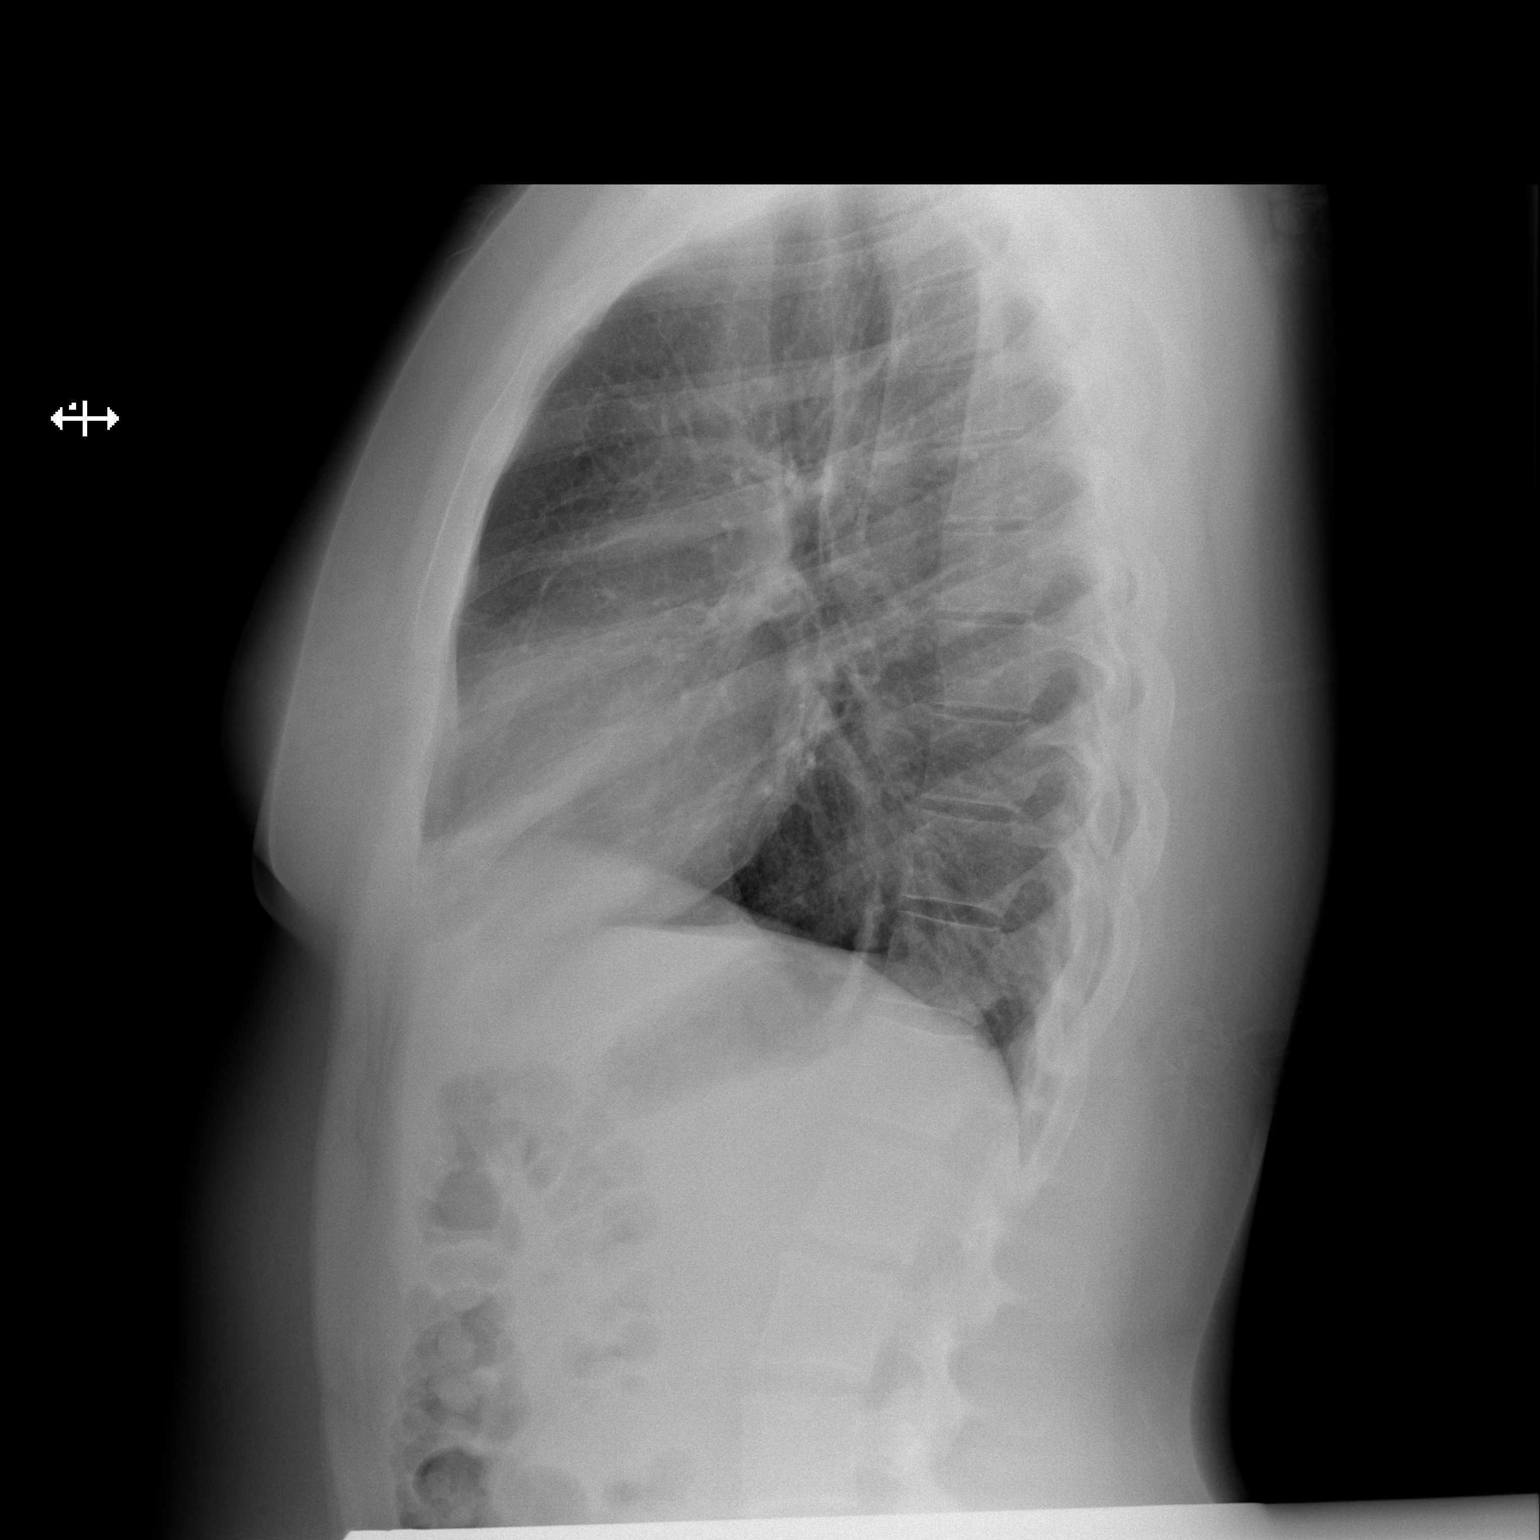

[2 of 2 positions shown; findings below may reference images not displayed]

FINDINGS: The heart size and mediastinal contours are within normal limits.
Both lungs are clear. The visualized skeletal structures are
unremarkable.
IMPRESSION: No active cardiopulmonary disease.

## 2020-11-12 ENCOUNTER — Ambulatory Visit (INDEPENDENT_AMBULATORY_CARE_PROVIDER_SITE_OTHER): Payer: 59 | Admitting: Psychology

## 2020-11-12 DIAGNOSIS — F331 Major depressive disorder, recurrent, moderate: Secondary | ICD-10-CM

## 2020-11-26 ENCOUNTER — Ambulatory Visit (INDEPENDENT_AMBULATORY_CARE_PROVIDER_SITE_OTHER): Payer: 59 | Admitting: Psychology

## 2020-11-26 DIAGNOSIS — F331 Major depressive disorder, recurrent, moderate: Secondary | ICD-10-CM | POA: Diagnosis not present

## 2020-12-10 ENCOUNTER — Ambulatory Visit (INDEPENDENT_AMBULATORY_CARE_PROVIDER_SITE_OTHER): Payer: 59 | Admitting: Psychology

## 2020-12-10 DIAGNOSIS — F331 Major depressive disorder, recurrent, moderate: Secondary | ICD-10-CM

## 2020-12-24 ENCOUNTER — Ambulatory Visit (INDEPENDENT_AMBULATORY_CARE_PROVIDER_SITE_OTHER): Payer: 59 | Admitting: Psychology

## 2020-12-24 DIAGNOSIS — F331 Major depressive disorder, recurrent, moderate: Secondary | ICD-10-CM | POA: Diagnosis not present

## 2021-01-07 ENCOUNTER — Ambulatory Visit (INDEPENDENT_AMBULATORY_CARE_PROVIDER_SITE_OTHER): Payer: 59 | Admitting: Psychology

## 2021-01-07 DIAGNOSIS — F331 Major depressive disorder, recurrent, moderate: Secondary | ICD-10-CM | POA: Diagnosis not present

## 2021-01-21 ENCOUNTER — Ambulatory Visit: Payer: 59 | Admitting: Psychology

## 2021-01-26 ENCOUNTER — Ambulatory Visit (INDEPENDENT_AMBULATORY_CARE_PROVIDER_SITE_OTHER): Payer: 59 | Admitting: Family

## 2021-01-26 ENCOUNTER — Encounter: Payer: Self-pay | Admitting: Family

## 2021-01-26 ENCOUNTER — Other Ambulatory Visit: Payer: Self-pay

## 2021-01-26 VITALS — BP 98/69 | HR 77 | Temp 98.2°F | Ht 63.0 in | Wt 198.2 lb

## 2021-01-26 DIAGNOSIS — Z3A01 Less than 8 weeks gestation of pregnancy: Secondary | ICD-10-CM

## 2021-01-26 DIAGNOSIS — N912 Amenorrhea, unspecified: Secondary | ICD-10-CM | POA: Diagnosis not present

## 2021-01-26 LAB — HCG, SERUM, QUALITATIVE: Preg, Serum: POSITIVE — AB

## 2021-01-27 ENCOUNTER — Ambulatory Visit (INDEPENDENT_AMBULATORY_CARE_PROVIDER_SITE_OTHER): Payer: 59 | Admitting: Psychology

## 2021-01-27 ENCOUNTER — Encounter (HOSPITAL_COMMUNITY): Payer: Self-pay | Admitting: Obstetrics & Gynecology

## 2021-01-27 ENCOUNTER — Inpatient Hospital Stay (HOSPITAL_COMMUNITY)
Admission: AD | Admit: 2021-01-27 | Discharge: 2021-01-27 | Disposition: A | Discharge status: Home / Self Care | Payer: 59 | Attending: Obstetrics & Gynecology | Admitting: Obstetrics & Gynecology

## 2021-01-27 ENCOUNTER — Inpatient Hospital Stay (HOSPITAL_COMMUNITY): Payer: 59

## 2021-01-27 ENCOUNTER — Other Ambulatory Visit: Payer: Self-pay

## 2021-01-27 DIAGNOSIS — F32A Depression, unspecified: Secondary | ICD-10-CM | POA: Insufficient documentation

## 2021-01-27 DIAGNOSIS — O99611 Diseases of the digestive system complicating pregnancy, first trimester: Secondary | ICD-10-CM | POA: Insufficient documentation

## 2021-01-27 DIAGNOSIS — Z79899 Other long term (current) drug therapy: Secondary | ICD-10-CM | POA: Insufficient documentation

## 2021-01-27 DIAGNOSIS — O209 Hemorrhage in early pregnancy, unspecified: Secondary | ICD-10-CM | POA: Insufficient documentation

## 2021-01-27 DIAGNOSIS — F419 Anxiety disorder, unspecified: Secondary | ICD-10-CM | POA: Insufficient documentation

## 2021-01-27 DIAGNOSIS — Z87891 Personal history of nicotine dependence: Secondary | ICD-10-CM | POA: Insufficient documentation

## 2021-01-27 DIAGNOSIS — O4690 Antepartum hemorrhage, unspecified, unspecified trimester: Secondary | ICD-10-CM

## 2021-01-27 DIAGNOSIS — Z3A01 Less than 8 weeks gestation of pregnancy: Secondary | ICD-10-CM | POA: Diagnosis not present

## 2021-01-27 DIAGNOSIS — F331 Major depressive disorder, recurrent, moderate: Secondary | ICD-10-CM

## 2021-01-27 DIAGNOSIS — K589 Irritable bowel syndrome without diarrhea: Secondary | ICD-10-CM | POA: Diagnosis not present

## 2021-01-27 DIAGNOSIS — Z6790 Unspecified blood type, Rh positive: Secondary | ICD-10-CM

## 2021-01-27 DIAGNOSIS — O99341 Other mental disorders complicating pregnancy, first trimester: Secondary | ICD-10-CM | POA: Diagnosis not present

## 2021-01-27 DIAGNOSIS — O3680X0 Pregnancy with inconclusive fetal viability, not applicable or unspecified: Secondary | ICD-10-CM | POA: Insufficient documentation

## 2021-01-27 LAB — URINALYSIS, ROUTINE W REFLEX MICROSCOPIC
Bilirubin Urine: NEGATIVE
Glucose, UA: NEGATIVE mg/dL
Ketones, ur: NEGATIVE mg/dL
Leukocytes,Ua: NEGATIVE
Nitrite: NEGATIVE
Protein, ur: NEGATIVE mg/dL
Specific Gravity, Urine: 1.02 (ref 1.005–1.030)
pH: 5 (ref 5.0–8.0)

## 2021-01-27 LAB — WET PREP, GENITAL
Clue Cells Wet Prep HPF POC: NONE SEEN
Sperm: NONE SEEN
Trich, Wet Prep: NONE SEEN
WBC, Wet Prep HPF POC: NONE SEEN
Yeast Wet Prep HPF POC: NONE SEEN

## 2021-01-27 LAB — COMPREHENSIVE METABOLIC PANEL
ALT: 18 U/L (ref 0–44)
AST: 17 U/L (ref 15–41)
Albumin: 3.9 g/dL (ref 3.5–5.0)
Alkaline Phosphatase: 72 U/L (ref 38–126)
Anion gap: 7 (ref 5–15)
BUN: 6 mg/dL (ref 6–20)
CO2: 24 mmol/L (ref 22–32)
Calcium: 9.1 mg/dL (ref 8.9–10.3)
Chloride: 107 mmol/L (ref 98–111)
Creatinine, Ser: 0.78 mg/dL (ref 0.44–1.00)
GFR, Estimated: >60 mL/min (ref >60)
Glucose, Bld: 92 mg/dL (ref 70–99)
Potassium: 4.3 mmol/L (ref 3.5–5.1)
Sodium: 138 mmol/L (ref 135–145)
Total Bilirubin: 1 mg/dL (ref 0.3–1.2)
Total Protein: 6.8 g/dL (ref 6.5–8.1)

## 2021-01-27 LAB — CBC WITH DIFFERENTIAL/PLATELET
Abs Immature Granulocytes: 0.02 10*3/uL (ref 0.00–0.07)
Basophils Absolute: 0.1 10*3/uL (ref 0.0–0.1)
Basophils Relative: 1 %
Eosinophils Absolute: 0.1 10*3/uL (ref 0.0–0.5)
Eosinophils Relative: 2 %
HCT: 41 % (ref 36.0–46.0)
Hemoglobin: 13.7 g/dL (ref 12.0–15.0)
Immature Granulocytes: 0 %
Lymphocytes Relative: 29 %
Lymphs Abs: 2.3 10*3/uL (ref 0.7–4.0)
MCH: 30 pg (ref 26.0–34.0)
MCHC: 33.4 g/dL (ref 30.0–36.0)
MCV: 89.9 fL (ref 80.0–100.0)
Monocytes Absolute: 0.5 10*3/uL (ref 0.1–1.0)
Monocytes Relative: 6 %
Neutro Abs: 5 10*3/uL (ref 1.7–7.7)
Neutrophils Relative %: 62 %
Platelets: 304 10*3/uL (ref 150–400)
RBC: 4.56 MIL/uL (ref 3.87–5.11)
RDW: 12.7 % (ref 11.5–15.5)
WBC: 8 10*3/uL (ref 4.0–10.5)
nRBC: 0 % (ref 0.0–0.2)

## 2021-01-27 LAB — HCG, QUANTITATIVE, PREGNANCY: hCG, Beta Chain, Quant, S: 78 m[IU]/mL — ABNORMAL HIGH (ref <5)

## 2021-01-27 MED ORDER — ACETAMINOPHEN 500 MG PO TABS
1000.0000 mg | ORAL_TABLET | Freq: Once | ORAL | Status: AC
  Administered 2021-01-27: 1000 mg via ORAL
  Filled 2021-01-27 (×2): qty 2

## 2021-01-27 NOTE — MAU Provider Note (Addendum)
History     CSN: [id number]  Arrival date and time: 01/27/21 1617   None     Chief Complaint  Patient presents with   Vaginal Bleeding   Abdominal Pain   HPI  Brianna Wilkinson is a 30 year old female [id number] at [redacted]w[redacted]d presenting to MAU for evaluation of vaginal bleeding and abdominal cramping that started around an hour ago at 4pm today while she was shopping at [place]. Patient saw blood on toilet paper when wiping after using the restroom. Did not enter the toilet bowl. Reports intermittent white discharge with a pink tinge to it and mild lower abdominal cramping for 1 week prior to experiencing vaginal bleeding today. Bilateral lower abdominal cramping suddenly became more intense and constant today when in the past it was mild and intermittent. Patient initially thought abdominal cramping was due to hunger, need to void, or need to have a bowel movement.  Denies nausea, vomiting, urinary symptoms, and fever. Last date of intercourse Sunday 01/25/21.   OB History     Gravida  3   Para      Term      Preterm      AB  2   Living         SAB  2   IAB      Ectopic      Multiple      Live Births              Past Medical History:  Diagnosis Date   Angio-edema    Anxiety    Asthma    Depression    Eczema    IBS (irritable bowel syndrome)     Past Surgical History:  Procedure Laterality Date   WISDOM TOOTH EXTRACTION      Family History  Problem Relation Age of Onset   Allergic rhinitis Mother    Depression Mother    Early death Mother    Mental illness Mother    Miscarriages / [place] Mother    Allergic rhinitis Father    Depression Father    Heart disease Father    Hypertension Father    Mental illness Father    Allergic rhinitis Sister    Mental illness Sister    Depression Maternal Grandmother    Early death Maternal Grandfather    Heart attack Maternal Grandfather    Hypertension Maternal Grandfather    Heart disease Maternal  Grandfather    Early death Paternal Grandfather    Heart attack Paternal Grandfather    Hypertension Paternal Grandfather    Allergic rhinitis Sister    Mental illness Sister     Social History   Tobacco Use   Smoking status: Former    Packs/day: 1.00    Years: 4.00    Pack years: 4.00    Types: Cigarettes   Smokeless tobacco: Never  Vaping Use   Vaping Use: Former  Substance Use Topics   Alcohol use: Yes    Comment: occasional   Drug use: Yes    Types: Marijuana    Comment: ocassional    Allergies:  Allergies  Allergen Reactions   Other Diarrhea    Green leafed foods, Spicy foods, Pork , grape food coloring   Pork-Derived Products Diarrhea    IBS    Penicillins Other (See Comments)    unknown    Medications Prior to Admission  Medication Sig Dispense Refill Last Dose   buPROPion (WELLBUTRIN XL) 300 MG 24 hr tablet TAKE 1  TABLET BY MOUTH EVERY DAY 90 tablet 3 Past Week   amphetamine-dextroamphetamine (ADDERALL XR) 20 MG 24 hr capsule Take 1 capsule (20 mg total) by mouth every morning. (Patient not taking: Reported on 01/26/2021) 30 capsule 0    Azelastine-Fluticasone 137-50 MCG/ACT SUSP Place into the nose.      Cyanocobalamin (B-12) 1000 MCG CAPS Take 1 capsule by mouth daily. 30 capsule 2    dicyclomine (BENTYL) 20 MG tablet TAKE 1 TABLET EVERY 6 HOURS 90 tablet 1    hydrOXYzine (ATARAX/VISTARIL) 50 MG tablet Take 0.5-2 tablets (25-100 mg total) by mouth at bedtime as needed (insomnia). 90 tablet 1    loratadine (CLARITIN) 10 MG tablet Take 10 mg by mouth daily.      Multiple Vitamin (MULTIVITAMIN) tablet Take 1 tablet by mouth daily.      naproxen (NAPROSYN) 500 MG tablet Take 1 tablet (500 mg total) by mouth 2 (two) times daily with a meal. 30 tablet 0    Norethindrone-Ethinyl Estradiol Triphasic (TRI-NORINYL) 0.5/1/0.5-35 MG-MCG tablet Take 1 tablet by mouth daily. (Patient not taking: Reported on 01/26/2021) 28 tablet 11    promethazine-dextromethorphan  (PROMETHAZINE-DM) 6.25-15 MG/5ML syrup promethazine-DM 6.25 mg-15 mg/5 mL oral syrup      valACYclovir (VALTREX) 1000 MG tablet Take 1 tablet (1,000 mg total) by mouth 2 (two) times daily. 180 tablet 1     Review of Systems  Constitutional:  Negative for fatigue and fever.  Gastrointestinal:  Positive for abdominal pain. Negative for constipation, nausea and vomiting.  Genitourinary:  Positive for vaginal bleeding. Negative for dysuria, frequency and urgency.  Neurological:  Negative for dizziness and light-headedness.    Physical Exam   Blood pressure 121/77, pulse 84, temperature 98 F (36.7 C), temperature source Oral, resp. rate 16, height 5\' 3"  (1.6 m), weight 89.8 kg, last menstrual period 12/23/2020, SpO2 97 %.  Physical Exam Vitals and nursing note reviewed.  Constitutional:      Appearance: She is well-developed.  Cardiovascular:     Rate and Rhythm: Normal rate.  Pulmonary:     Effort: Pulmonary effort is normal.  Abdominal:     Palpations: Abdomen is soft.     Tenderness: There is abdominal tenderness in the suprapubic area.  Genitourinary:    Vagina: Bleeding present.  Neurological:     General: No focal deficit present.     Mental Status: She is alert.  Psychiatric:        Mood and Affect: Mood normal.        Behavior: Behavior normal.    MAU Course  Procedures  Results for orders placed or performed during the hospital encounter of 01/27/21 (from the past 24 hour(s))  Urinalysis, Routine w reflex microscopic Urine, Clean Catch     Status: Abnormal   Collection Time: 01/27/21  5:15 PM  Result Value Ref Range   Color, Urine YELLOW YELLOW   APPearance HAZY (A) CLEAR   Specific Gravity, Urine 1.020 1.005 - 1.030   pH 5.0 5.0 - 8.0   Glucose, UA NEGATIVE NEGATIVE mg/dL   Hgb urine dipstick LARGE (A) NEGATIVE   Bilirubin Urine NEGATIVE NEGATIVE   Ketones, ur NEGATIVE NEGATIVE mg/dL   Protein, ur NEGATIVE NEGATIVE mg/dL   Nitrite NEGATIVE NEGATIVE    Leukocytes,Ua NEGATIVE NEGATIVE   RBC / HPF 0-5 0 - 5 RBC/hpf   WBC, UA 0-5 0 - 5 WBC/hpf   Bacteria, UA RARE (A) NONE SEEN   Squamous Epithelial / LPF 0-5 0 - 5  Mucus PRESENT   CBC with Differential/Platelet     Status: None   Collection Time: 01/27/21  5:56 PM  Result Value Ref Range   WBC 8.0 4.0 - 10.5 K/uL   RBC 4.56 3.87 - 5.11 MIL/uL   Hemoglobin 13.7 12.0 - 15.0 g/dL   HCT 85.8 85.0 - 27.7 %   MCV 89.9 80.0 - 100.0 fL   MCH 30.0 26.0 - 34.0 pg   MCHC 33.4 30.0 - 36.0 g/dL   RDW 41.2 87.8 - 67.6 %   Platelets 304 150 - 400 K/uL   nRBC 0.0 0.0 - 0.2 %   Neutrophils Relative % 62 %   Neutro Abs 5.0 1.7 - 7.7 K/uL   Lymphocytes Relative 29 %   Lymphs Abs 2.3 0.7 - 4.0 K/uL   Monocytes Relative 6 %   Monocytes Absolute 0.5 0.1 - 1.0 K/uL   Eosinophils Relative 2 %   Eosinophils Absolute 0.1 0.0 - 0.5 K/uL   Basophils Relative 1 %   Basophils Absolute 0.1 0.0 - 0.1 K/uL   Immature Granulocytes 0 %   Abs Immature Granulocytes 0.02 0.00 - 0.07 K/uL  Comprehensive metabolic panel     Status: None   Collection Time: 01/27/21  5:56 PM  Result Value Ref Range   Sodium 138 135 - 145 mmol/L   Potassium 4.3 3.5 - 5.1 mmol/L   Chloride 107 98 - 111 mmol/L   CO2 24 22 - 32 mmol/L   Glucose, Bld 92 70 - 99 mg/dL   BUN 6 6 - 20 mg/dL   Creatinine, Ser 7.20 0.44 - 1.00 mg/dL   Calcium 9.1 8.9 - 94.7 mg/dL   Total Protein 6.8 6.5 - 8.1 g/dL   Albumin 3.9 3.5 - 5.0 g/dL   AST 17 15 - 41 U/L   ALT 18 0 - 44 U/L   Alkaline Phosphatase 72 38 - 126 U/L   Total Bilirubin 1.0 0.3 - 1.2 mg/dL   GFR, Estimated >09 >62 mL/min   Anion gap 7 5 - 15  hCG, quantitative, pregnancy     Status: Abnormal   Collection Time: 01/27/21  5:56 PM  Result Value Ref Range   hCG, Beta Chain, Quant, S 78 (H) <5 mIU/mL  ABO/Rh     Status: None   Collection Time: 01/27/21  5:56 PM  Result Value Ref Range   ABO/RH(D)      A POS Performed at [facility], [address], [place], [place]  [address]   Wet prep, genital     Status: None   Collection Time: 01/27/21  6:19 PM  Result Value Ref Range   Yeast Wet Prep HPF POC NONE SEEN NONE SEEN   Trich, Wet Prep NONE SEEN NONE SEEN   Clue Cells Wet Prep HPF POC NONE SEEN NONE SEEN   WBC, Wet Prep HPF POC NONE SEEN NONE SEEN   Sperm NONE SEEN    MDM Transvaginal US OB less than 14 weeks performed to rule out ectopic pregnancy. IMPRESSION: No intrauterine gestational sac, yolk sac, or fetal pole identified. Differential considerations include intrauterine pregnancy too early to be sonographically visualized, missed abortion, or ectopic pregnancy. Followup ultrasound is recommended in 10-14 days for further evaluation.   Electronically Signed   By: Genevive Bi M.D.   On: 01/27/2021 19:54  Hematuria noted on UA- patient denies urinary symptoms. CMET and CBC WNL.   Assessment and Plan  Pregnancy of unknown anatomic location  Vaginal  bleeding in pregnancy - Plan: US OB LESS THAN 14 WEEKS WITH OB TRANSVAGINAL, US OB LESS THAN 14 WEEKS WITH OB TRANSVAGINAL  [redacted] weeks gestation of pregnancy  Blood type, Rh positive   Discussed reasons to return to MAU for further evaluation. No prescriptions today. Patient moving to [place], [place] on Friday and instructed to follow-up with an OBGYN local to [place] regarding results from today. Patient instructed to return to the clinic for a follow-up HCG in 48 hours.  Thurston Pounds 01/27/2021, 4:57 PM    I supervised the student during this patient encounter. Please see provider note for complete documentation.   Marylen Ponto, NP 01/27/2021 8:34 PM

## 2021-01-27 NOTE — MAU Note (Signed)
Brianna Wilkinson is a 30 y.o. at [redacted]w[redacted]d here in MAU reporting: when she was at [facility] within the past 30 min to 1 hour and she used the bathroom and saw some bleeding and now she is cramping. Has been having light pink spotting but today it was more and she saw it when she wiped.   LMP: 12/23/2020  Onset of complaint: today  Pain score: 7/10  Vitals:   01/27/21 1637  BP: 121/77  Pulse: 84  Resp: 16  Temp: 98 F (36.7 C)  SpO2: 97%     Lab orders placed from triage: UA

## 2021-01-27 NOTE — Discharge Instructions (Signed)

## 2021-01-27 NOTE — MAU Provider Note (Signed)
History     CSN: [id number]  Arrival date and time: 01/27/21 1617   Event Date/Time   First Provider Initiated Contact with Patient 01/27/21 1745      Chief Complaint  Patient presents with   Vaginal Bleeding   Abdominal Pain   [name] is a 30 y.o. G3P0020 at [redacted]w[redacted]d who presents to MAU for vaginal bleeding which began a few days ago. Patient reports initially she saw some pink spotting in her discharge, but today she saw some spotting while wiping after using the restroom. Patient reports mild pelvic cramping as well.  Pt reports she is moving to [place] on Friday.  Pt denies vaginal discharge/odor/itching. Pt denies N/V, abdominal pain, constipation, diarrhea, or urinary problems. Pt denies fever, chills, fatigue, sweating or changes in appetite. Pt denies SOB or chest pain. Pt denies dizziness, HA, light-headedness, weakness.   OB History     Gravida  3   Para      Term      Preterm      AB  2   Living         SAB  2   IAB      Ectopic      Multiple      Live Births              Past Medical History:  Diagnosis Date   Angio-edema    Anxiety    Asthma    Depression    Eczema    IBS (irritable bowel syndrome)     Past Surgical History:  Procedure Laterality Date   WISDOM TOOTH EXTRACTION      Family History  Problem Relation Age of Onset   Allergic rhinitis Mother    Depression Mother    Early death Mother    Mental illness Mother    Miscarriages / [place] Mother    Allergic rhinitis Father    Depression Father    Heart disease Father    Hypertension Father    Mental illness Father    Allergic rhinitis Sister    Mental illness Sister    Depression Maternal Grandmother    Early death Maternal Grandfather    Heart attack Maternal Grandfather    Hypertension Maternal Grandfather    Heart disease Maternal Grandfather    Early death Paternal Grandfather    Heart attack Paternal Grandfather    Hypertension Paternal  Grandfather    Allergic rhinitis Sister    Mental illness Sister     Social History   Tobacco Use   Smoking status: Former    Packs/day: 1.00    Years: 4.00    Pack years: 4.00    Types: Cigarettes   Smokeless tobacco: Never  Vaping Use   Vaping Use: Former  Substance Use Topics   Alcohol use: Yes    Comment: occasional   Drug use: Yes    Types: Marijuana    Comment: ocassional    Allergies:  Allergies  Allergen Reactions   Other Diarrhea    Green leafed foods, Spicy foods, Pork , grape food coloring   Pork-Derived Products Diarrhea    IBS    Penicillins Other (See Comments)    unknown    Medications Prior to Admission  Medication Sig Dispense Refill Last Dose   buPROPion (WELLBUTRIN XL) 300 MG 24 hr tablet TAKE 1 TABLET BY MOUTH EVERY DAY 90 tablet 3 Past Week   amphetamine-dextroamphetamine (ADDERALL XR) 20 MG 24 hr capsule Take 1  capsule (20 mg total) by mouth every morning. (Patient not taking: Reported on 01/26/2021) 30 capsule 0    Azelastine-Fluticasone 137-50 MCG/ACT SUSP Place into the nose.      Cyanocobalamin (B-12) 1000 MCG CAPS Take 1 capsule by mouth daily. 30 capsule 2    dicyclomine (BENTYL) 20 MG tablet TAKE 1 TABLET EVERY 6 HOURS 90 tablet 1    hydrOXYzine (ATARAX/VISTARIL) 50 MG tablet Take 0.5-2 tablets (25-100 mg total) by mouth at bedtime as needed (insomnia). 90 tablet 1    loratadine (CLARITIN) 10 MG tablet Take 10 mg by mouth daily.      Multiple Vitamin (MULTIVITAMIN) tablet Take 1 tablet by mouth daily.      naproxen (NAPROSYN) 500 MG tablet Take 1 tablet (500 mg total) by mouth 2 (two) times daily with a meal. 30 tablet 0    Norethindrone-Ethinyl Estradiol Triphasic (TRI-NORINYL) 0.5/1/0.5-35 MG-MCG tablet Take 1 tablet by mouth daily. (Patient not taking: Reported on 01/26/2021) 28 tablet 11    promethazine-dextromethorphan (PROMETHAZINE-DM) 6.25-15 MG/5ML syrup promethazine-DM 6.25 mg-15 mg/5 mL oral syrup      valACYclovir (VALTREX) 1000 MG  tablet Take 1 tablet (1,000 mg total) by mouth 2 (two) times daily. 180 tablet 1     Review of Systems  Constitutional:  Negative for chills, diaphoresis, fatigue and fever.  Eyes:  Negative for visual disturbance.  Respiratory:  Negative for shortness of breath.   Cardiovascular:  Negative for chest pain.  Gastrointestinal:  Negative for abdominal pain, constipation, diarrhea, nausea and vomiting.  Genitourinary:  Positive for pelvic pain and vaginal bleeding. Negative for dysuria, flank pain, frequency, urgency and vaginal discharge.  Neurological:  Negative for dizziness, weakness, light-headedness and headaches.  Physical Exam   Blood pressure 121/77, pulse 84, temperature 98 F (36.7 C), temperature source Oral, resp. rate 16, height 5\' 3"  (1.6 m), weight 89.8 kg, last menstrual period 12/23/2020, SpO2 97 %.  Patient Vitals for the past 24 hrs:  BP Temp Temp src Pulse Resp SpO2 Height Weight  01/27/21 1637 121/77 98 F (36.7 C) Oral 84 16 97 % -- --  01/27/21 1634 -- -- -- -- -- -- 5\' 3"  (1.6 m) 89.8 kg   Physical Exam Vitals and nursing note reviewed.  Constitutional:      General: She is not in acute distress.    Appearance: Normal appearance. She is not ill-appearing, toxic-appearing or diaphoretic.  HENT:     Head: Normocephalic and atraumatic.  Pulmonary:     Effort: Pulmonary effort is normal.  Neurological:     Mental Status: She is alert and oriented to person, place, and time.  Psychiatric:        Mood and Affect: Mood normal.        Behavior: Behavior normal.        Thought Content: Thought content normal.        Judgment: Judgment normal.   Results for orders placed or performed during the hospital encounter of 01/27/21 (from the past 24 hour(s))  Urinalysis, Routine w reflex microscopic Urine, Clean Catch     Status: Abnormal   Collection Time: 01/27/21  5:15 PM  Result Value Ref Range   Color, Urine YELLOW YELLOW   APPearance HAZY (A) CLEAR   Specific  Gravity, Urine 1.020 1.005 - 1.030   pH 5.0 5.0 - 8.0   Glucose, UA NEGATIVE NEGATIVE mg/dL   Hgb urine dipstick LARGE (A) NEGATIVE   Bilirubin Urine NEGATIVE NEGATIVE   Ketones, ur NEGATIVE  NEGATIVE mg/dL   Protein, ur NEGATIVE NEGATIVE mg/dL   Nitrite NEGATIVE NEGATIVE   Leukocytes,Ua NEGATIVE NEGATIVE   RBC / HPF 0-5 0 - 5 RBC/hpf   WBC, UA 0-5 0 - 5 WBC/hpf   Bacteria, UA RARE (A) NONE SEEN   Squamous Epithelial / LPF 0-5 0 - 5   Mucus PRESENT   CBC with Differential/Platelet     Status: None   Collection Time: 01/27/21  5:56 PM  Result Value Ref Range   WBC 8.0 4.0 - 10.5 K/uL   RBC 4.56 3.87 - 5.11 MIL/uL   Hemoglobin 13.7 12.0 - 15.0 g/dL   HCT 16.141.0 09.636.0 - 04.546.0 %   MCV 89.9 80.0 - 100.0 fL   MCH 30.0 26.0 - 34.0 pg   MCHC 33.4 30.0 - 36.0 g/dL   RDW 40.912.7 81.111.5 - 91.415.5 %   Platelets 304 150 - 400 K/uL   nRBC 0.0 0.0 - 0.2 %   Neutrophils Relative % 62 %   Neutro Abs 5.0 1.7 - 7.7 K/uL   Lymphocytes Relative 29 %   Lymphs Abs 2.3 0.7 - 4.0 K/uL   Monocytes Relative 6 %   Monocytes Absolute 0.5 0.1 - 1.0 K/uL   Eosinophils Relative 2 %   Eosinophils Absolute 0.1 0.0 - 0.5 K/uL   Basophils Relative 1 %   Basophils Absolute 0.1 0.0 - 0.1 K/uL   Immature Granulocytes 0 %   Abs Immature Granulocytes 0.02 0.00 - 0.07 K/uL  Comprehensive metabolic panel     Status: None   Collection Time: 01/27/21  5:56 PM  Result Value Ref Range   Sodium 138 135 - 145 mmol/L   Potassium 4.3 3.5 - 5.1 mmol/L   Chloride 107 98 - 111 mmol/L   CO2 24 22 - 32 mmol/L   Glucose, Bld 92 70 - 99 mg/dL   BUN 6 6 - 20 mg/dL   Creatinine, Ser 7.820.78 0.44 - 1.00 mg/dL   Calcium 9.1 8.9 - 95.610.3 mg/dL   Total Protein 6.8 6.5 - 8.1 g/dL   Albumin 3.9 3.5 - 5.0 g/dL   AST 17 15 - 41 U/L   ALT 18 0 - 44 U/L   Alkaline Phosphatase 72 38 - 126 U/L   Total Bilirubin 1.0 0.3 - 1.2 mg/dL   GFR, Estimated >21>60 >30>60 mL/min   Anion gap 7 5 - 15  hCG, quantitative, pregnancy     Status: Abnormal   Collection  Time: 01/27/21  5:56 PM  Result Value Ref Range   hCG, Beta Chain, Quant, S 78 (H) <5 mIU/mL  ABO/Rh     Status: None   Collection Time: 01/27/21  5:56 PM  Result Value Ref Range   ABO/RH(D)      A POS Performed at [facility], [address] [place], [place] [address]   Wet prep, genital     Status: None   Collection Time: 01/27/21  6:19 PM  Result Value Ref Range   Yeast Wet Prep HPF POC NONE SEEN NONE SEEN   Trich, Wet Prep NONE SEEN NONE SEEN   Clue Cells Wet Prep HPF POC NONE SEEN NONE SEEN   WBC, Wet Prep HPF POC NONE SEEN NONE SEEN   Sperm NONE SEEN    US OB LESS THAN 14 WEEKS WITH OB TRANSVAGINAL  Result Date: 01/27/2021 CLINICAL DATA:  30 year old female with vaginal bleeding. Estimated gestational age by last menstrual period equals 5 weeks 0 days EXAM: OBSTETRIC <  14 WK [place] AND TRANSVAGINAL OB US TECHNIQUE: Both transabdominal and transvaginal ultrasound examinations were performed for complete evaluation of the gestation as well as the maternal uterus, adnexal regions, and pelvic cul-de-sac. Transvaginal technique was performed to assess early pregnancy. COMPARISON:  None. FINDINGS: Intrauterine gestational sac: Not identified Yolk sac:  Not identified Embryo:  Not identified Subchorionic hemorrhage:  None visualized. Maternal uterus/adnexae: Normal ovaries. Potential corpus luteal cyst of the LEFT ovary. Trace free fluid. IMPRESSION: No intrauterine gestational sac, yolk sac, or fetal pole identified. Differential considerations include intrauterine pregnancy too early to be sonographically visualized, missed abortion, or ectopic pregnancy. Followup ultrasound is recommended in 10-14 days for further evaluation. Electronically Signed   By: Genevive Bi M.D.   On: 01/27/2021 19:54    MAU Course  Procedures  MDM -r/o ectopic -UA: hazy/lg hgb/rare bacteria, sending urine for cultures -CBC: WNL -CMP: WNL -[place]: PUL -hCG: 78 -ABO: A Positive -WetPrep: WNL -GC/CT  collected -pt with pain after [place] (reports also gets sore after sex), requesting Tylenol prior to discharge -Discussed with client the diagnosis of pregnancy of unknown anatomic location.  Three possibilities of outcome are: a healthy pregnancy that is too early to see a yolk sac to confirm the pregnancy is in the uterus, a pregnancy that is not healthy and has not developed and will not develop, and an ectopic pregnancy that is in the abdomen that cannot be identified at this time.  And ectopic pregnancy can be a life threatening situation as a pregnancy needs to be in the uterus which is a muscle and can stretch to accommodate the growth of a pregnancy.  Other structures in the pelvis and abdomen as not muscular and do not stretch with the growth of a pregnancy.  Worst case scenario is that a structure ruptures with a growing pregnancy not in the uterus and and internal hemorrhage can be a life threatening situation.  We need to follow the progression of this pregnancy carefully.  We need to check another serum pregnancy hormone level to determine if the levels are rising appropriately  and to determine the next steps that are needed for you. Patient's questions were answered. -pt discharged to home in stable condition  Orders Placed This Encounter  Procedures   Wet prep, genital    Standing Status:   Standing    Number of Occurrences:   1   Culture, OB Urine    Standing Status:   Standing    Number of Occurrences:   1   US OB LESS THAN 14 WEEKS WITH OB TRANSVAGINAL    Standing Status:   Standing    Number of Occurrences:   1    Order Specific Question:   Symptom/Reason for Exam    Answer:   Vaginal bleeding in pregnancy [[id number]]   Urinalysis, Routine w reflex microscopic Urine, Clean Catch    Standing Status:   Standing    Number of Occurrences:   1   CBC with Differential/Platelet    Standing Status:   Standing    Number of Occurrences:   1   Comprehensive metabolic panel    Standing  Status:   Standing    Number of Occurrences:   1   hCG, quantitative, pregnancy    Standing Status:   Standing    Number of Occurrences:   1   ABO/Rh    Standing Status:   Standing    Number of Occurrences:   1   Discharge patient  Order Specific Question:   Discharge disposition    Answer:   01-Home or Self Care [1]    Order Specific Question:   Discharge patient date    Answer:   01/27/2021   Meds ordered this encounter  Medications   acetaminophen (TYLENOL) tablet 1,000 mg    Assessment and Plan   1. Pregnancy of unknown anatomic location   2. Vaginal bleeding in pregnancy   3. [redacted] weeks gestation of pregnancy   4. Blood type, Rh positive    Allergies as of 01/27/2021       Reactions   Other Diarrhea   Green leafed foods, Spicy foods, Pork , grape food coloring   Pork-derived Products Diarrhea   IBS    Penicillins Other (See Comments)   unknown        Medication List     STOP taking these medications    naproxen 500 MG tablet Commonly known as: Naprosyn   Norethindrone-Ethinyl Estradiol Triphasic 0.5/1/0.5-35 MG-MCG tablet Commonly known as: TRI-NORINYL       TAKE these medications    Azelastine-Fluticasone 137-50 MCG/ACT Susp Place into the nose.   B-12 1000 MCG Caps Take 1 capsule by mouth daily.   buPROPion 300 MG 24 hr tablet Commonly known as: WELLBUTRIN XL TAKE 1 TABLET BY MOUTH EVERY DAY   dicyclomine 20 MG tablet Commonly known as: BENTYL TAKE 1 TABLET EVERY 6 HOURS   hydrOXYzine 50 MG tablet Commonly known as: ATARAX/VISTARIL Take 0.5-2 tablets (25-100 mg total) by mouth at bedtime as needed (insomnia).   loratadine 10 MG tablet Commonly known as: CLARITIN Take 10 mg by mouth daily.   multivitamin tablet Take 1 tablet by mouth daily.   promethazine-dextromethorphan 6.25-15 MG/5ML syrup Commonly known as: PROMETHAZINE-DM promethazine-DM 6.25 mg-15 mg/5 mL oral syrup   valACYclovir 1000 MG tablet Commonly known as:  VALTREX Take 1 tablet (1,000 mg total) by mouth 2 (two) times daily.       ASK your doctor about these medications    amphetamine-dextroamphetamine 20 MG 24 hr capsule Commonly known as: ADDERALL XR Take 1 capsule (20 mg total) by mouth every morning.       -will call with culture results, if positive -safe meds in pregnancy list given -list of OB providers given -discussed ectopic vs. First trimester vs. miscarriage -strict ectopic precautions given -return MAU precautions -f/u on 01/30/2021 at [facility] for repeat hCG, message sent to office, no appts available for scheduling at [facility], [facility], [facility]; pt aware that back-up plan is to come to MAU at 8AM on Friday morning if she does not get an appointment scheduled at office -pt discharged to home in stable condition  Joni Reining E Sejal Cofield 01/27/2021, 8:33 PM

## 2021-01-28 ENCOUNTER — Telehealth: Payer: Self-pay

## 2021-01-28 LAB — GC/CHLAMYDIA PROBE AMP (~~LOC~~) NOT AT ARMC
Chlamydia: NEGATIVE
Comment: NEGATIVE
Comment: NORMAL
Neisseria Gonorrhea: NEGATIVE

## 2021-01-28 LAB — ABO/RH: ABO/RH(D): A POS

## 2021-01-28 NOTE — Telephone Encounter (Signed)
Lvm for the pt to call back. Pt has reviewed mychart message from "Brianna Wilkinson". She will call back with questions or concerns.

## 2021-01-28 NOTE — Telephone Encounter (Signed)
Patient is returning phone call from Melitta.  

## 2021-01-28 NOTE — Progress Notes (Signed)
Acute Office Visit  Subjective:    Patient ID: Brianna Wilkinson, female    DOB: 1991-06-08, 30 y.o.   MRN: [id number]  Chief Complaint  Patient presents with   Amenorrhea    LMP: 12/23/2020    HPI Patient is a G2P0, in today after testing positive for pregnancy 3 times at home. She has had one negative pregnancy test and is seeking confirmation. Her last menstrual period was 12/23/20. She is uncertain how she really feels about being pregnant. She has had a history miscarriage. She is planning to move to [facility] soon and is unsure if now is the best time to be a mother  Past Medical History:  Diagnosis Date   Angio-edema    Anxiety    Asthma    Depression    Eczema    IBS (irritable bowel syndrome)     Past Surgical History:  Procedure Laterality Date   WISDOM TOOTH EXTRACTION      Family History  Problem Relation Age of Onset   Allergic rhinitis Mother    Depression Mother    Early death Mother    Mental illness Mother    Miscarriages / [place] Mother    Allergic rhinitis Father    Depression Father    Heart disease Father    Hypertension Father    Mental illness Father    Allergic rhinitis Sister    Mental illness Sister    Depression Maternal Grandmother    Early death Maternal Grandfather    Heart attack Maternal Grandfather    Hypertension Maternal Grandfather    Heart disease Maternal Grandfather    Early death Paternal Grandfather    Heart attack Paternal Grandfather    Hypertension Paternal Grandfather    Allergic rhinitis Sister    Mental illness Sister     Social History   Socioeconomic History   Marital status: Single    Spouse name: Not on file   Number of children: Not on file   Years of education: Not on file   Highest education level: Associate degree: occupational, Scientist, product/process development, or vocational program  Occupational History    Employer: [employer]  Tobacco Use   Smoking status: Former    Packs/day: 1.00    Years: 4.00     Pack years: 4.00    Types: Cigarettes   Smokeless tobacco: Never  Vaping Use   Vaping Use: Former  Substance and Sexual Activity   Alcohol use: Yes    Comment: occasional   Drug use: Yes    Types: Marijuana    Comment: ocassional   Sexual activity: Yes    Birth control/protection: Pill  Other Topics Concern   Not on file  Social History Narrative   Not on file   Social Determinants of Health   Financial Resource Strain: Not on file  Food Insecurity: Not on file  Transportation Needs: Not on file  Physical Activity: Not on file  Stress: Not on file  Social Connections: Not on file  Intimate Partner Violence: Not on file    Outpatient Medications Prior to Visit  Medication Sig Dispense Refill   Azelastine-Fluticasone 137-50 MCG/ACT SUSP Place into the nose.     buPROPion (WELLBUTRIN XL) 300 MG 24 hr tablet TAKE 1 TABLET BY MOUTH EVERY DAY 90 tablet 3   Cyanocobalamin (B-12) 1000 MCG CAPS Take 1 capsule by mouth daily. 30 capsule 2   dicyclomine (BENTYL) 20 MG tablet TAKE 1 TABLET EVERY 6 HOURS 90 tablet 1  hydrOXYzine (ATARAX/VISTARIL) 50 MG tablet Take 0.5-2 tablets (25-100 mg total) by mouth at bedtime as needed (insomnia). 90 tablet 1   loratadine (CLARITIN) 10 MG tablet Take 10 mg by mouth daily.     Multiple Vitamin (MULTIVITAMIN) tablet Take 1 tablet by mouth daily.     promethazine-dextromethorphan (PROMETHAZINE-DM) 6.25-15 MG/5ML syrup promethazine-DM 6.25 mg-15 mg/5 mL oral syrup     valACYclovir (VALTREX) 1000 MG tablet Take 1 tablet (1,000 mg total) by mouth 2 (two) times daily. 180 tablet 1   naproxen (NAPROSYN) 500 MG tablet Take 1 tablet (500 mg total) by mouth 2 (two) times daily with a meal. 30 tablet 0   amphetamine-dextroamphetamine (ADDERALL XR) 20 MG 24 hr capsule Take 1 capsule (20 mg total) by mouth every morning. (Patient not taking: Reported on 01/26/2021) 30 capsule 0   Norethindrone-Ethinyl Estradiol Triphasic (TRI-NORINYL) 0.5/1/0.5-35 MG-MCG tablet  Take 1 tablet by mouth daily. (Patient not taking: Reported on 01/26/2021) 28 tablet 11   No facility-administered medications prior to visit.    Allergies  Allergen Reactions   Other Diarrhea    Green leafed foods, Spicy foods, Pork , grape food coloring   Pork-Derived Products Diarrhea    IBS    Penicillins Other (See Comments)    unknown    Review of Systems  Constitutional: Negative.   Eyes: Negative.   Respiratory: Negative.    Cardiovascular: Negative.   Endocrine: Negative.   Genitourinary: Negative.   Musculoskeletal: Negative.   Neurological: Negative.   Psychiatric/Behavioral: Negative.    All other systems reviewed and are negative.     Objective:    Physical Exam Vitals and nursing note reviewed.  Constitutional:      Appearance: Normal appearance.  Cardiovascular:     Rate and Rhythm: Normal rate and regular rhythm.     Pulses: Normal pulses.     Heart sounds: Normal heart sounds.  Pulmonary:     Effort: Pulmonary effort is normal.     Breath sounds: Normal breath sounds.  Musculoskeletal:        General: Normal range of motion.     Cervical back: Normal range of motion and neck supple.  Neurological:     Mental Status: She is alert.  Psychiatric:        Mood and Affect: Mood normal.        Behavior: Behavior normal.  POC UPT is faintly positive.  BP 98/69   Pulse 77   Temp 98.2 F (36.8 C) (Temporal)   Ht 5\' 3"  (1.6 m)   Wt 198 lb 3.2 oz (89.9 kg)   LMP 12/23/2020 (Exact Date)   SpO2 97%   BMI 35.11 kg/m  Wt Readings from Last 3 Encounters:  01/27/21 198 lb (89.8 kg)  01/26/21 198 lb 3.2 oz (89.9 kg)  11/03/20 197 lb 3.2 oz (89.4 kg)    Health Maintenance Due  Topic Date Due   Hepatitis C Screening  Never done    There are no preventive care reminders to display for this patient.   Lab Results  Component Value Date   TSH 2.10 09/15/2018   Lab Results  Component Value Date   WBC 8.0 01/27/2021   HGB 13.7 01/27/2021   HCT  41.0 01/27/2021   MCV 89.9 01/27/2021   PLT 304 01/27/2021   Lab Results  Component Value Date   NA 138 01/27/2021   K 4.3 01/27/2021   CO2 24 01/27/2021   GLUCOSE 92 01/27/2021   BUN 6  01/27/2021   CREATININE 0.78 01/27/2021   BILITOT 1.0 01/27/2021   ALKPHOS 72 01/27/2021   AST 17 01/27/2021   ALT 18 01/27/2021   PROT 6.8 01/27/2021   ALBUMIN 3.9 01/27/2021   CALCIUM 9.1 01/27/2021   ANIONGAP 7 01/27/2021   GFR 95.91 02/26/2019   Lab Results  Component Value Date   CHOL 155 09/15/2018   Lab Results  Component Value Date   HDL 42.80 09/15/2018   Lab Results  Component Value Date   LDLCALC 96 09/15/2018   Lab Results  Component Value Date   TRIG 82.0 09/15/2018   Lab Results  Component Value Date   CHOLHDL 4 09/15/2018   Lab Results  Component Value Date   HGBA1C 4.9 09/15/2018       Assessment & Plan:   Problem List Items Addressed This Visit     Dysmenorrhea - Primary   Other Visit Diagnoses     Less than [redacted] weeks gestation of pregnancy           Plan: EDD is September 29, 2021. Patient will let me know if she would like a OB referral or referral for pregnancy termination. Recommend prenatal vitamin   No orders of the defined types were placed in this encounter.    Eulis Foster, FNP

## 2021-01-28 NOTE — Telephone Encounter (Signed)
Patient returned call regarding lab results

## 2021-01-28 NOTE — Telephone Encounter (Signed)
Please see lab note.

## 2021-01-29 ENCOUNTER — Other Ambulatory Visit: Payer: Self-pay

## 2021-01-29 ENCOUNTER — Encounter: Payer: Self-pay | Admitting: Family Medicine

## 2021-01-29 ENCOUNTER — Other Ambulatory Visit (INDEPENDENT_AMBULATORY_CARE_PROVIDER_SITE_OTHER): Payer: 59

## 2021-01-29 VITALS — Wt 197.2 lb

## 2021-01-29 DIAGNOSIS — O3680X0 Pregnancy with inconclusive fetal viability, not applicable or unspecified: Secondary | ICD-10-CM | POA: Diagnosis not present

## 2021-01-29 LAB — BETA HCG QUANT (REF LAB): hCG Quant: 55 m[IU]/mL

## 2021-01-29 NOTE — Progress Notes (Signed)
Patient was assessed and managed by nursing staff during this encounter. I have reviewed the chart and agree with the documentation and plan. I have also made any necessary editorial changes.  Catalina Antigua, MD 01/29/2021 4:33 PM

## 2021-01-29 NOTE — Progress Notes (Signed)
Beta HCG Follow-up Visit  Brianna Wilkinson presents to [facility] for follow-up beta HCG lab. She was seen in MAU for  abdominal pain and vaginal bleeding  on 01/27/21. Patient reports bleeding like a period and having menstrual like cramps since discharge from MAU. Discussed with patient that we are following beta HCG levels today. Results will be back in approximately 2 hours. Valid contact number for patient confirmed. I will call the patient with results.   Patient is moving tomorrow to [place], [place]. ROI signed prior to leaving office for records to be sent to [facility] where patient has appt next Thursday, 02/05/21 for follow-up.  Beta HCG results: 01/27/21 78  01/29/21 55      Results and patient history reviewed with Constant, MD, who states this is not a viable pregnancy but ectopic pregnancy cannot be completely ruled out. Patient should keep appointment with new OB/GYN office. Needs a repeat beta HCG to confirm miscarriage. Patient called and informed of plan for follow-up. Ectopic precautions given. Pt encouraged to follow up with our office for any concerns in the future.  Marjo Bicker 01/29/2021 2:56 PM

## 2021-01-30 LAB — CULTURE, OB URINE: Culture: 10000 — AB

## 2021-02-04 ENCOUNTER — Ambulatory Visit (INDEPENDENT_AMBULATORY_CARE_PROVIDER_SITE_OTHER): Payer: 59 | Admitting: Psychology

## 2021-02-04 DIAGNOSIS — F331 Major depressive disorder, recurrent, moderate: Secondary | ICD-10-CM

## 2021-02-06 ENCOUNTER — Other Ambulatory Visit: Payer: Self-pay | Admitting: Family Medicine

## 2021-02-06 DIAGNOSIS — F339 Major depressive disorder, recurrent, unspecified: Secondary | ICD-10-CM

## 2021-02-18 ENCOUNTER — Ambulatory Visit (INDEPENDENT_AMBULATORY_CARE_PROVIDER_SITE_OTHER): Payer: 59 | Admitting: Psychology

## 2021-02-18 DIAGNOSIS — F331 Major depressive disorder, recurrent, moderate: Secondary | ICD-10-CM | POA: Diagnosis not present

## 2021-03-04 ENCOUNTER — Ambulatory Visit (INDEPENDENT_AMBULATORY_CARE_PROVIDER_SITE_OTHER): Payer: 59 | Admitting: Psychology

## 2021-03-04 DIAGNOSIS — F331 Major depressive disorder, recurrent, moderate: Secondary | ICD-10-CM

## 2021-03-18 ENCOUNTER — Ambulatory Visit (INDEPENDENT_AMBULATORY_CARE_PROVIDER_SITE_OTHER): Payer: 59 | Admitting: Psychology

## 2021-03-18 DIAGNOSIS — F331 Major depressive disorder, recurrent, moderate: Secondary | ICD-10-CM | POA: Diagnosis not present

## 2021-04-01 ENCOUNTER — Ambulatory Visit: Payer: 59 | Admitting: Psychology

## 2021-04-15 ENCOUNTER — Ambulatory Visit: Payer: 59 | Admitting: Psychology

## 2022-02-24 ENCOUNTER — Encounter (INDEPENDENT_AMBULATORY_CARE_PROVIDER_SITE_OTHER): Payer: Self-pay

## 2022-03-05 NOTE — Unmapped (Signed)
Formatting of this note might be different from the original.  Visit Number: 3    Subjective     Referring physician: Sheila Oats, MD    Patient reports her right neck region continues to feel much better and overall the right upper arm symptoms are improved as well as compared to earlier this week    Objective     Palpation:  Increased tenderness still noted when palpating right levator scapula    AROM: Sitting shoulder flexion: L 165 degrees, R 162 degrees.    Treatments    Therapeutic Exercises [phone]) to develop strength, endurance, range of motion, flexibility  25 minutes, 12 minutes direct  -UBE backwards x 8 min     -Pulleys 5 min     -Wall walks x 20     -Corner pectoralis stretch 3x30 sec     -R self-scapular stretch 3x30 sec     -L cervical rotation stretch 3x30 sec     -R UT stretch 3x30 sec     -R levator scapula stretch 3c30 sec       Neuromuscular Re-Education (32355) for movement, balance, coordination, kinesthetic sense, posture, balance for sitting and/or standing activities  15 minutes, 15 minutes direct  -Theraband B extension green x 30     -Theraband B rows black x 30     -Theraband IR blue x 30     -Theraband ER blue x 30     -Prone shoulder extension 0# x 30     -Prone shoulder mid-trap 0# x 30     -Prone shoulder low trap 0# x 30     -Prone rows x 30     -Standing B shoulder flexion and scaption 1# x 30       Manual Therapy (97140) for joint mobilization/manipulation, soft tissue mobilization  2 minutes, 2 minutes direct  -Shoulder PROM and oscillations         The therapy interventions in this note were performed separately and at distinct intervals of time.    Assessment   Patient continues to report less right cervical and right shoulder and upper arm symptoms since she began physical therapy.  Added prone scapular strengthening exercises as well as standing deltoid strengthening exercises to home exercise program.  Skilled care still required for cervical and right shoulder regions  to improve range of motion, flexibility and strength in order to eliminate pain with all activities.    Plan     2x/week for therapeutic exercise, therapeutic activity, neuromuscular re-education, manual therapy and modalities as indicated in order to decrease pain, increase ROM and strength, improve flexibility and improve tolerance to ADL's.    Voice recognition software is utilized and this note may contain transcription errors.  An addendum can be issued upon request.      Electronically signed by Marya Fossa, MPT at 03/05/2022  9:53 AM EDT

## 2022-03-29 ENCOUNTER — Ambulatory Visit: Admit: 2022-03-29 | Discharge: 2022-03-29 | Payer: MEDICAID | Attending: Internal Medicine | Primary: Internal Medicine

## 2022-03-29 DIAGNOSIS — Z0000 Encounter for general adult medical examination without abnormal findings: Secondary | ICD-10-CM

## 2022-03-29 MED ORDER — NICOTINE 7 MG/24HR TD PT24
7 MG/24HR | MEDICATED_PATCH | TRANSDERMAL | 3 refills | Status: AC
Start: 2022-03-29 — End: 2023-03-29

## 2022-03-29 MED ORDER — HYDROXYZINE HCL 25 MG PO TABS
25 MG | ORAL_TABLET | Freq: Every day | ORAL | 0 refills | Status: DC | PRN
Start: 2022-03-29 — End: 2022-07-15

## 2022-03-29 MED ORDER — AMPHETAMINE-DEXTROAMPHET ER 20 MG PO CP24
20 MG | ORAL_CAPSULE | Freq: Every day | ORAL | 0 refills | Status: AC
Start: 2022-03-29 — End: 2022-06-27

## 2022-03-29 MED ORDER — AMPHETAMINE-DEXTROAMPHET ER 20 MG PO CP24
20 MG | ORAL_CAPSULE | Freq: Every day | ORAL | 0 refills | Status: DC
Start: 2022-03-29 — End: 2022-07-15

## 2022-03-29 MED ORDER — AMPHETAMINE-DEXTROAMPHET ER 20 MG PO CP24
20 MG | ORAL_CAPSULE | Freq: Every day | ORAL | 0 refills | Status: AC
Start: 2022-03-29 — End: 2022-05-28

## 2022-03-29 NOTE — Progress Notes (Signed)
Subjective:      Patient ID: Marie Simpson is a 31 y.o. female here to establish care.  She has relocated from [place].  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  Happy with her work.  She has ADHD and anxiety disorder, lidocaine cream and Adderall and hydroxyzine.  Has history of manic depression, not taking medication.  Managing with therapy.  She is sexually active.  Pap smear done last year was up-to-date.  She had a miscarriage last year.      Here for complete physical.  Medication Refill    ADHD        Review of Systems   Constitutional: Negative.    HENT: Negative.     Eyes: Negative.    Respiratory: Negative.     Cardiovascular: Negative.    Gastrointestinal: Negative.    Endocrine: Negative.    Genitourinary: Negative.    Musculoskeletal: Negative.    Skin: Negative.    Neurological: Negative.    Hematological: Negative.    Psychiatric/Behavioral: Negative.         Objective:   Physical Exam  Constitutional:       Appearance: Normal appearance. She is obese.   HENT:      Head: Normocephalic and atraumatic.      Right Ear: Tympanic membrane normal.      Left Ear: Tympanic membrane normal.      Nose: Nose normal.      Mouth/Throat:      Mouth: Mucous membranes are dry.      Pharynx: Oropharynx is clear.   Cardiovascular:      Rate and Rhythm: Normal rate and regular rhythm.      Pulses: Normal pulses.      Heart sounds: Normal heart sounds.   Pulmonary:      Effort: Pulmonary effort is normal.      Breath sounds: Normal breath sounds.   Abdominal:      General: Abdomen is flat. Bowel sounds are normal.      Palpations: Abdomen is soft.   Musculoskeletal:         General: Normal range of motion.      Cervical back: Normal range of motion and neck supple.   Skin:     General: Skin is warm.   Neurological:      General: No focal deficit present.      Mental Status: She is alert and oriented to person, place, and time. Mental status is at baseline.   Psychiatric:         Mood and Affect: Mood normal.          Behavior: Behavior normal.         Thought Content: Thought content normal.         Assessment / Plan:       Diagnoses and all orders for this visit:  Routine general medical examination at a health care facility    Seems healthy.  Advised to eat healthy and exercise and try to lose weight.  We will check,  -     CBC with Auto Differential  -     Comprehensive Metabolic Panel  -     TSH  -     Lipid Panel  -     Urinalysis with Microscopic  Attention deficit hyperactivity disorder (ADHD), unspecified ADHD type    She already been diagnosed with ADHD, already had labs in the system.  Will refill,  -     amphetamine-dextroamphetamine (ADDERALL  XR) 20 MG extended release capsule; Take 1 capsule by mouth daily for 30 days. Max Daily Amount: 20 mg  -     amphetamine-dextroamphetamine (ADDERALL XR) 20 MG extended release capsule; Take 1 capsule by mouth daily for 30 days. Max Daily Amount: 20 mg  -     amphetamine-dextroamphetamine (ADDERALL XR) 20 MG extended release capsule; Take 1 capsule by mouth daily for 30 days. Max Daily Amount: 20 mg    We will make her sign controlled substance contract  .  Also will need urine test next time.  Anxiety attack    We will refill,  -     hydrOXYzine HCl (ATARAX) 25 MG tablet; Take 1 tablet by mouth daily as needed for Itching  Morbid obesity (HCC)      1. Consume 3 meals per day, do not skip meals.   2. Chose low fat, portion controlled foods for snack and desserts such as low fat chocolate pudding, low fat flavored yogurt, 100 calorie snack packs, etc.   3. Increase moderate intensity exercise to 30 minutes at least 5 times per week.   4. Read nutrition facts labels to identify foods that are lean, extra lean and low fat  The patient is asked to make an attempt to improve diet and exercise patterns to aid in medical management of this problem.    Smoking    We will order,  -     nicotine (NICODERM CQ) 7 MG/24HR; Place 1 patch onto the skin every 24 hours  Need for hepatitis  C screening test  -     Hepatitis C Antibody     Discussed expected course/resolution/complications of diagnosis in detail with patient.   Medication risks/benefits/costs/interactions/alternatives discussed with patient.   Pt was given an after visit summary which includes diagnoses, current medications & vitals.   Pt expressed understanding with the diagnosis and plan.

## 2022-03-29 NOTE — Progress Notes (Signed)
1. "Have you been to the ER, urgent care clinic since your last visit?  Hospitalized since your last visit?" No    2. "Have you seen or consulted any other health care providers outside of the [facility] since your last visit?" No    3. For patients aged 30-75: Has the patient had a colonoscopy / FIT/ Cologuard? Recommendation: Colonoscopy every 10y or annual FIT test from 50-75 or every 3 year stool DNA based test with consideration of ongoing screening from 76-85.      If the patient is female:    4. For patients aged 40-74: Has the patient had a mammogram within the past 2 years? No    5. For patients aged 21-65: Has the patient had a pap smear? No

## 2022-03-30 NOTE — Telephone Encounter (Signed)
Please advise.

## 2022-03-30 NOTE — Telephone Encounter (Signed)
Pt states insurance does not cover the name brand adderall. She wants to know if she can get a new [facility]

## 2022-03-30 NOTE — Telephone Encounter (Signed)
I called pharmacy, Marie Simpson said that it is in as a generic, it says anyone over the age of 40 requires a PA, so it needs a PA.

## 2022-03-31 NOTE — Telephone Encounter (Signed)
Submitted PA.

## 2022-04-12 ENCOUNTER — Encounter: Payer: Self-pay | Admitting: *Deleted

## 2022-07-01 ENCOUNTER — Encounter: Payer: Self-pay | Admitting: *Deleted

## 2022-07-08 ENCOUNTER — Encounter: Payer: MEDICAID | Attending: Internal Medicine | Primary: Internal Medicine

## 2022-07-15 ENCOUNTER — Ambulatory Visit
Admit: 2022-07-15 | Discharge: 2022-07-15 | Payer: BLUE CROSS/BLUE SHIELD | Attending: Family Health | Primary: Internal Medicine

## 2022-07-15 DIAGNOSIS — F909 Attention-deficit hyperactivity disorder, unspecified type: Secondary | ICD-10-CM

## 2022-07-15 LAB — AMB POC HEMOGLOBIN A1C: Hemoglobin A1C, POC: 5 %

## 2022-07-15 MED ORDER — HYDROXYZINE HCL 25 MG PO TABS
25 MG | ORAL_TABLET | Freq: Every day | ORAL | 1 refills | Status: DC | PRN
Start: 2022-07-15 — End: 2022-10-11

## 2022-07-15 MED ORDER — AMPHETAMINE-DEXTROAMPHET ER 20 MG PO CP24
20 MG | ORAL_CAPSULE | Freq: Every day | ORAL | 0 refills | Status: DC
Start: 2022-07-15 — End: 2022-10-17

## 2022-07-15 MED ORDER — AMPHETAMINE-DEXTROAMPHET ER 20 MG PO CP24
20 MG | ORAL_CAPSULE | Freq: Every day | ORAL | 0 refills | Status: DC
Start: 2022-07-15 — End: 2022-10-15

## 2022-07-15 MED ORDER — VALACYCLOVIR HCL 1 G PO TABS
1 g | ORAL_TABLET | Freq: Two times a day (BID) | ORAL | 0 refills | Status: DC | PRN
Start: 2022-07-15 — End: 2022-07-30

## 2022-07-15 MED ORDER — DICYCLOMINE HCL 20 MG PO TABS
20 MG | ORAL_TABLET | Freq: Four times a day (QID) | ORAL | 0 refills | Status: AC
Start: 2022-07-15 — End: 2023-11-14

## 2022-07-15 NOTE — Progress Notes (Signed)
Marie Simpson (DOB:  January 13, 1991) is a 31 y.o. female,Established patient, here for evaluation of the following chief complaint(s):  ADHD         ASSESSMENT/PLAN:  1. Attention deficit hyperactivity disorder (ADHD), unspecified ADHD type  -     amphetamine-dextroamphetamine (ADDERALL XR) 20 MG extended release capsule; Take 1 capsule by mouth daily for 30 days. Max Daily Amount: 20 mg, Disp-30 capsule, R-0Normal  -     amphetamine-dextroamphetamine (ADDERALL XR) 20 MG extended release capsule; Take 1 capsule by mouth daily for 30 days. Max Daily Amount: 20 mg, Disp-30 capsule, R-0Normal  -     amphetamine-dextroamphetamine (ADDERALL XR) 20 MG extended release capsule; Take 1 capsule by mouth daily for 30 days. Max Daily Amount: 20 mg, Disp-30 capsule, R-0Normal  2. Elevated glucose  -     AMB POC HEMOGLOBIN A1C  3. Anxiety  -     hydrOXYzine HCl (ATARAX) 25 MG tablet; Take 1 tablet by mouth daily as needed for Itching, Disp-90 tablet, R-1Normal    ADHD stable, continue Adderall. Refill to pharmacy on file x 3 months. [place].   A1c stable at 5.0. Discussed indication for caloric deficit as well as 150 minutes of exercise weekly.   Anxiety mostly stable besides current situational exacerbation, continue hydroxyzine.   Encouraged to call this office with any questions or concerns.       Return in about 3 months (around 10/14/2022).         Subjective   SUBJECTIVE/OBJECTIVE:  Marie Simpson presents for a routine follow up of ADHD. States that she is doing well. Current dosage is effective, only takes Adderall on days when she has to work.   Since she has been out on winter break she has not had to take it, generally does not take it on the weekends either.   Admits that she is dealing with some anxiety recently, her dog passed away one week ago today which was unexpected. He was 30 years old and newly diagnosed with cushing's.   Was recently seen at [facility] and had her labs done, mostly unremarkable. Glucose  was 106 which was concerning to her as she had not eaten that day.   Admits that she would like to get her weight down, has done so in the past with the keto diet.   Requesting a refill of valacyclovir, gets cold sores intermittently and this usually provides relief.           Review of Systems   Constitutional: Negative.    Respiratory: Negative.     Cardiovascular: Negative.    Neurological: Negative.           Objective   Physical Exam  Vitals reviewed.   Cardiovascular:      Rate and Rhythm: Normal rate and regular rhythm.      Pulses: Normal pulses.      Heart sounds: Normal heart sounds.   Pulmonary:      Effort: Pulmonary effort is normal.      Breath sounds: Normal breath sounds.   Skin:     General: Skin is warm and dry.   Neurological:      Mental Status: She is alert and oriented to person, place, and time.   Psychiatric:      Comments: Pleasant                 An electronic signature was used to authenticate this note.    --Kathryne Eriksson, APRN -  CNP

## 2022-07-30 NOTE — Telephone Encounter (Signed)
LOV: 07/15/2022  NOV: 10/11/2022

## 2022-07-30 NOTE — Telephone Encounter (Signed)
LOV: 07/15/2022  NOV: 10/11/2022    Medication:   valACYclovir (VALTREX) 1 g tablet   Quantity: 180

## 2022-08-02 MED ORDER — VALACYCLOVIR HCL 1 G PO TABS
1 g | ORAL_TABLET | Freq: Two times a day (BID) | ORAL | 0 refills | Status: AC | PRN
Start: 2022-08-02 — End: 2022-09-01

## 2022-08-05 NOTE — Progress Notes (Signed)
PA Case: [id number], Status: Approved, Coverage Starts on: 08/05/2022 12:00:00 AM, Coverage Ends on: 08/05/2023 12:00:00 AM.     Adderall XR

## 2022-10-11 ENCOUNTER — Ambulatory Visit: Payer: BLUE CROSS/BLUE SHIELD | Attending: Family Health | Primary: Internal Medicine

## 2022-10-11 ENCOUNTER — Encounter

## 2022-10-11 MED ORDER — HYDROXYZINE HCL 25 MG PO TABS
25 | ORAL_TABLET | Freq: Every day | ORAL | 1 refills | Status: AC | PRN
Start: 2022-10-11 — End: ?

## 2022-10-11 NOTE — Telephone Encounter (Signed)
[id number] [place] - [address] - P [phone] - F [phone]      hydrOXYzine HCl (ATARAX) 25 MG tablet     Quantity 90 tabs    07/15/2022  10/15/2022

## 2022-10-15 ENCOUNTER — Ambulatory Visit
Admit: 2022-10-15 | Discharge: 2022-10-15 | Payer: BLUE CROSS/BLUE SHIELD | Attending: Family Health | Primary: Internal Medicine

## 2022-10-15 ENCOUNTER — Encounter

## 2022-10-15 DIAGNOSIS — Z3201 Encounter for pregnancy test, result positive: Secondary | ICD-10-CM

## 2022-10-15 NOTE — Progress Notes (Addendum)
Chief Complaint   Patient presents with    Follow-up Chronic Condition    ADHD    Discuss Medications     "Have you been to the ER, urgent care clinic since your last visit?  Hospitalized since your last visit?"    Abdominal pain   Marie Simpson  March 9    "Have you seen or consulted any other health care providers outside of [facility] since your last visit?"    NO     "Have you had a pap smear?"    [facility] [address]    No cervical cancer screening on file             Click Here for Release of Records Request

## 2022-10-15 NOTE — Progress Notes (Signed)
Marie Simpson (DOB:  August 12, 1990) is a 32 y.o. female,Established patient, here for evaluation of the following chief complaint(s):  Follow-up Chronic Condition, ADHD, and Discuss Medications         ASSESSMENT/PLAN:  1. Positive urine pregnancy test  -     HCG Qualitative, Serum; Future  2. Attention deficit hyperactivity disorder (ADHD), unspecified ADHD type  3. Other specified counseling      Will confirm pregnancy with serum. Educated on need to continue to abstain from Adderall use.   Also educated on avoidance of NSAIDS, raw/undercooked foods, alcohol, tobacco, and any illicit drugs.   Aware of need to maintain scheduled appointment with OBGYN.   If ADHD symptoms become problematic, may need to consider referral for therapy.   Will follow up pending test results and discuss any additional plan of care.   Encouraged to call with any questions or concerns in the interim.         Return if symptoms worsen or fail to improve.         Subjective   SUBJECTIVE/OBJECTIVE:  Marie Simpson presents for a follow up visit. Appointment was originally scheduled for ADHD management, but she recently learned that she is pregnant. LMP was on 3-3, took a home urine pregnancy test about 5 days ago, positive.   Has not been taking Adderall for at least a couple of weeks. Admits that the pregnancy was not intentional but she has been having unprotected sex with her boyfriend, use the pull out method. He is not particularly excited about the baby, but her grandmother is. She has some anxiety about the pregnancy as two years ago she had an early miscarriage; last year had a uterine polyp removed, in hindsight thinks polyp may have contributed to the miscarriage. No concerns about being off of her Adderall and she has good familial support. Has an appointment scheduled with OBGYN for 11-17-22.         Review of Systems   Constitutional:  Negative for chills and fever.   Respiratory:  Negative for shortness of breath.    Cardiovascular:   Negative for chest pain.   Gastrointestinal:  Negative for abdominal pain.   Genitourinary:  Negative for pelvic pain.   Neurological:  Negative for headaches.        Past Medical History:   Diagnosis Date    ADHD (attention deficit hyperactivity disorder)     Anxiety     Manic depression (HCC)     Obesity      Current Outpatient Medications on File Prior to Visit   Medication Sig Dispense Refill    hydrOXYzine HCl (ATARAX) 25 MG tablet Take 1 tablet by mouth daily as needed for Itching 90 tablet 1    budesonide-formoterol (SYMBICORT) 160-4.5 MCG/ACT AERO Inhale 2 puffs into the lungs in the morning and at bedtime      omeprazole (PRILOSEC) 20 MG delayed release capsule Take 1 capsule by mouth daily      dicyclomine (BENTYL) 20 MG tablet Take 1 tablet by mouth every 6 hours for 90 doses 90 tablet 0     No current facility-administered medications on file prior to visit.         Objective   Physical Exam  Vitals reviewed.   Constitutional:       Appearance: She is well-groomed.   Cardiovascular:      Rate and Rhythm: Normal rate and regular rhythm.      Pulses: Normal pulses.  Heart sounds: Normal heart sounds.   Pulmonary:      Effort: Pulmonary effort is normal.      Breath sounds: Normal breath sounds.   Neurological:      Mental Status: She is alert and oriented to person, place, and time.   Psychiatric:      Comments: Pleasant                   An electronic signature was used to authenticate this note.    --Kathryne Eriksson, APRN - CNP

## 2022-10-16 LAB — HCG, SERUM, QUALITATIVE: hCG Qual: POSITIVE m[IU]/mL — AB (ref <6)

## 2022-10-26 NOTE — Telephone Encounter (Signed)
Marie Simpson was called and verbalized understanding on note below.

## 2022-10-26 NOTE — Telephone Encounter (Signed)
Please return call on results for pregnancy test

## 2022-11-04 NOTE — Telephone Encounter (Signed)
Mingo Amber, MD  You14 minutes ago (12:47 PM)       Not comfortable to call in Zofran.  She needs to contact her OB for nausea medicine

## 2022-11-04 NOTE — Telephone Encounter (Signed)
Patient called stating that she is pregnant and is having nausea and vomiting. She wants to know if zofran can be called in for her. Please call her back at [phone]

## 2022-11-04 NOTE — Telephone Encounter (Signed)
Marie Simpson was called and verbalized understanding on note below.

## 2023-03-30 ENCOUNTER — Encounter

## 2023-06-17 IMAGING — US US OB < 14 WEEKS - US OB TV
1 series · 15 of 28 positions shown · non-contrast
Comparison: None.

CLINICAL DATA: 27-year-old female with vaginal bleeding. Estimated
gestational age by last menstrual period equals 5 weeks 0 days

EXAM:
OBSTETRIC <14 WK US AND TRANSVAGINAL OB US
TECHNIQUE: Both transabdominal and transvaginal ultrasound examinations were
performed for complete evaluation of the gestation as well as the
maternal uterus, adnexal regions, and pelvic cul-de-sac.
Transvaginal technique was performed to assess early pregnancy.

[Series 1: us ob < 14 weeks - us ob tv · 15 of 46 slices shown]
[im 1/46]
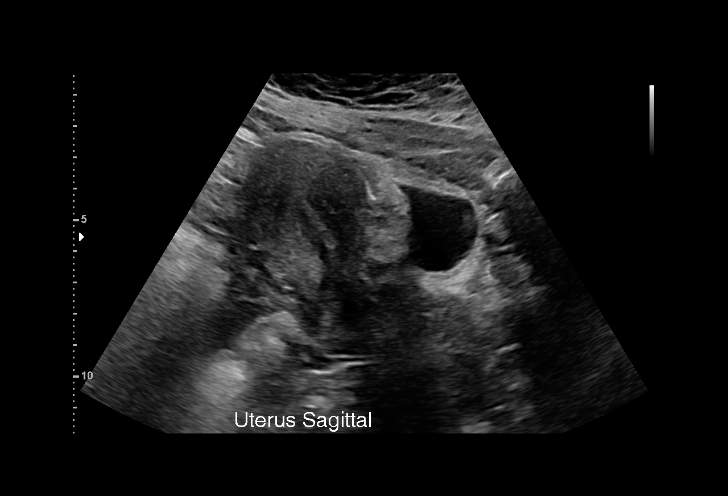
[im 4/46]
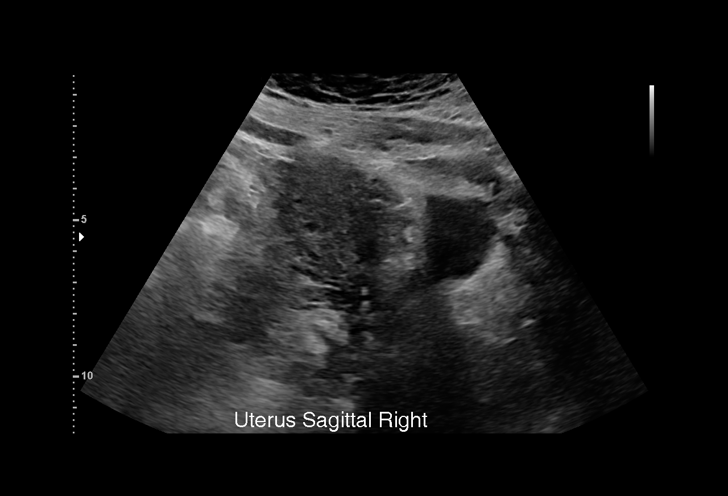
[im 7/46]
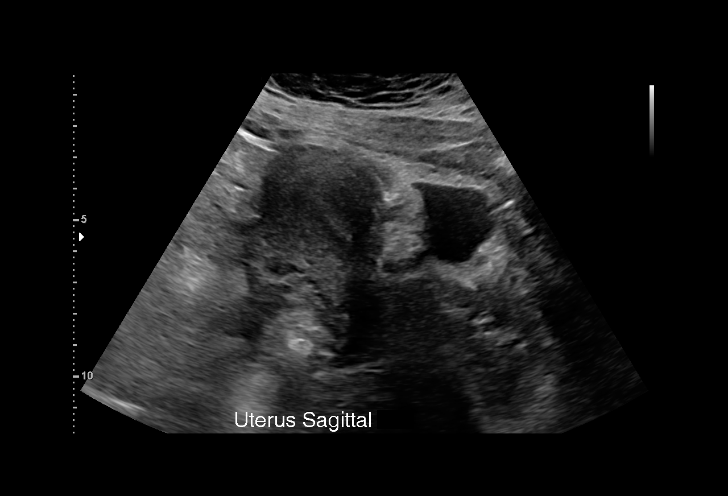
[im 11/46]
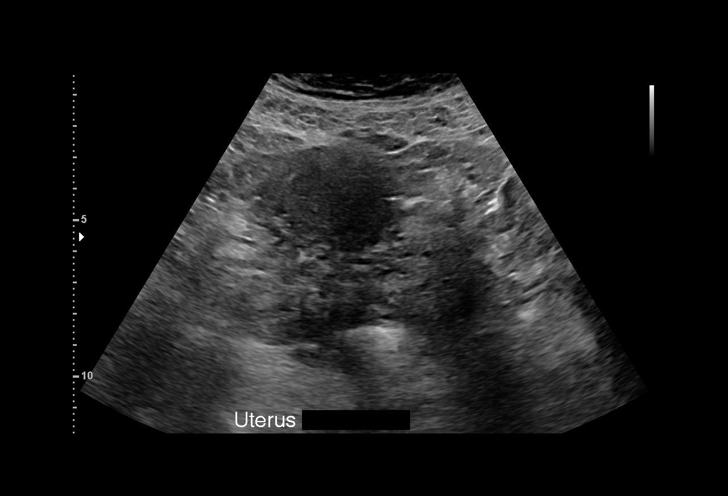
[im 14/46]
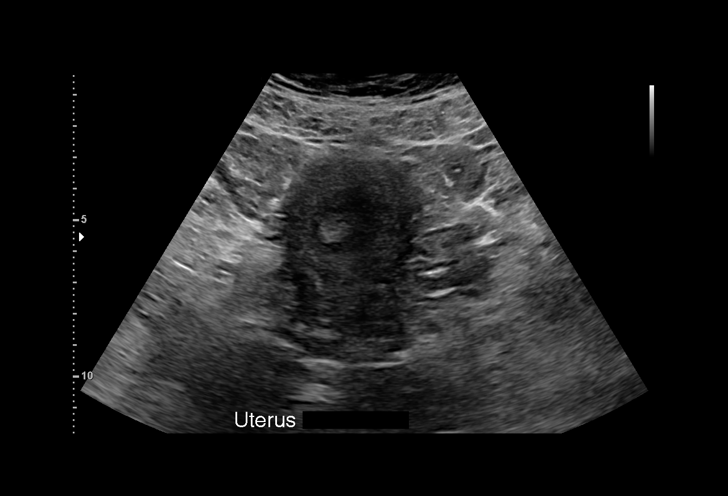
[im 17/46]
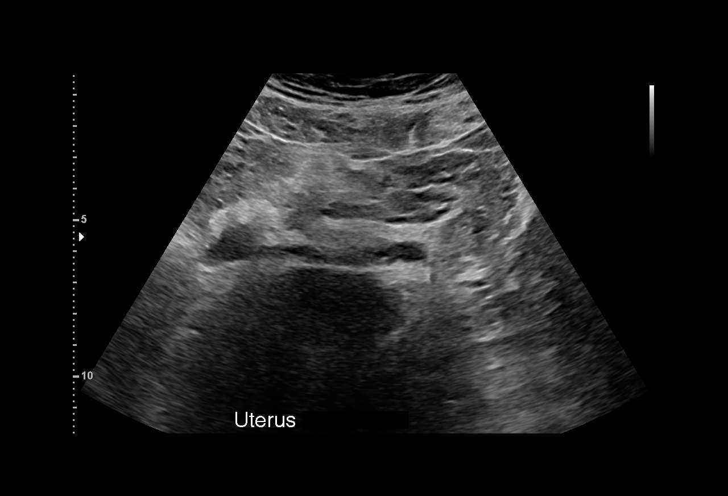
[im 21/46]
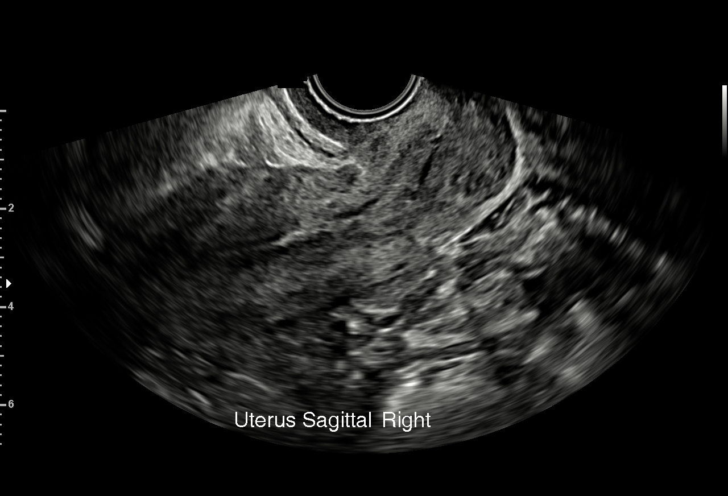
[im 24/46]
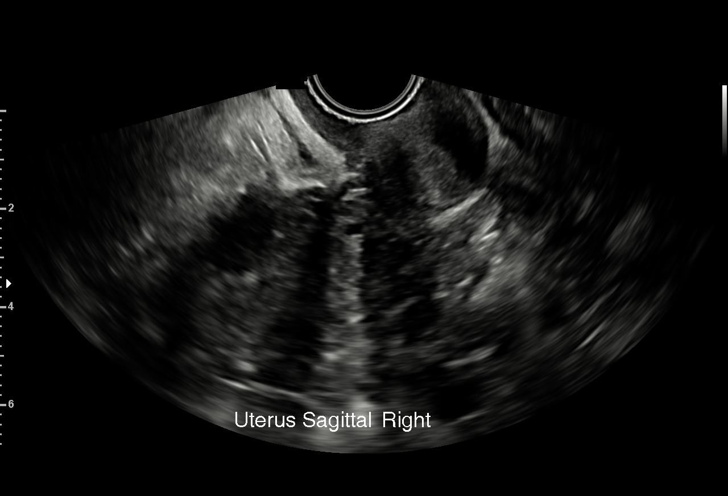
[im 26/46]
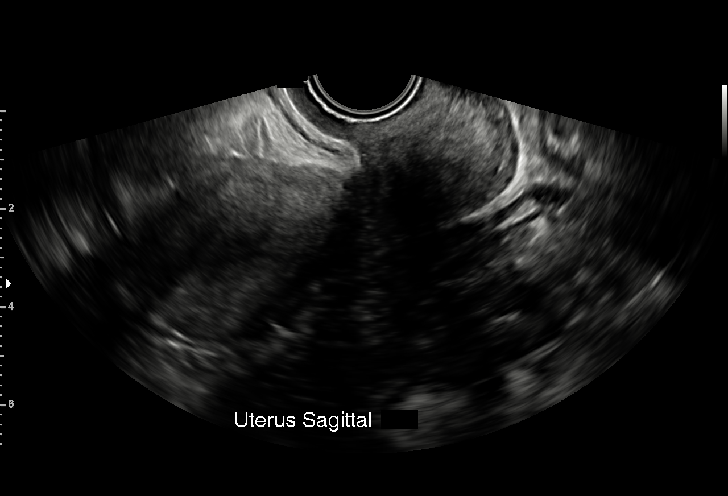
[im 29/46]
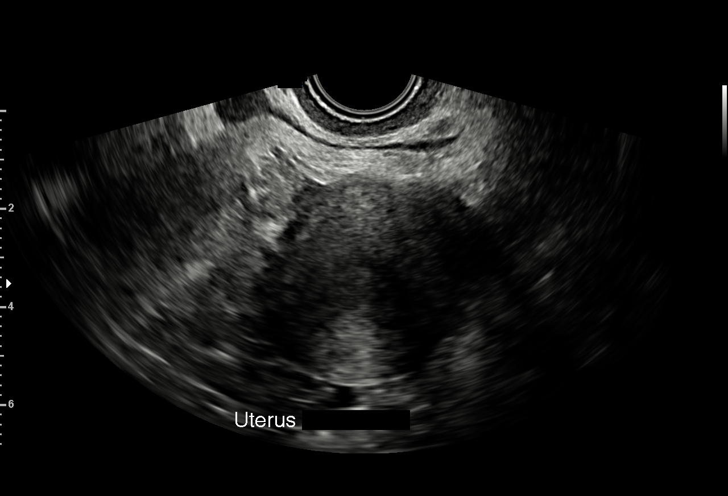
[im 32/46]
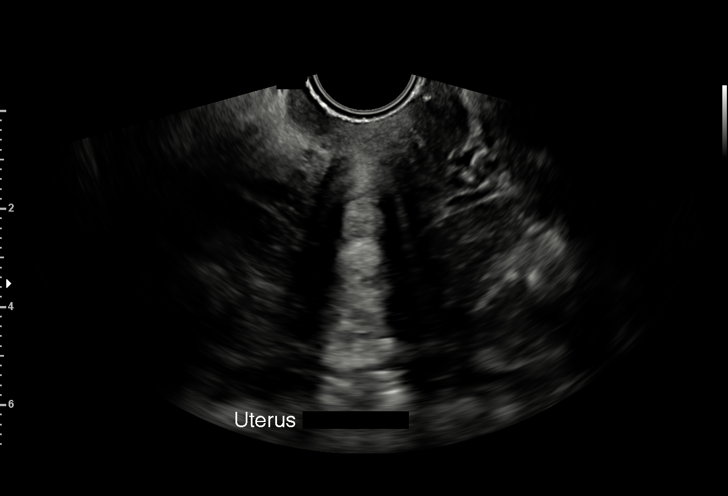
[im 36/46]
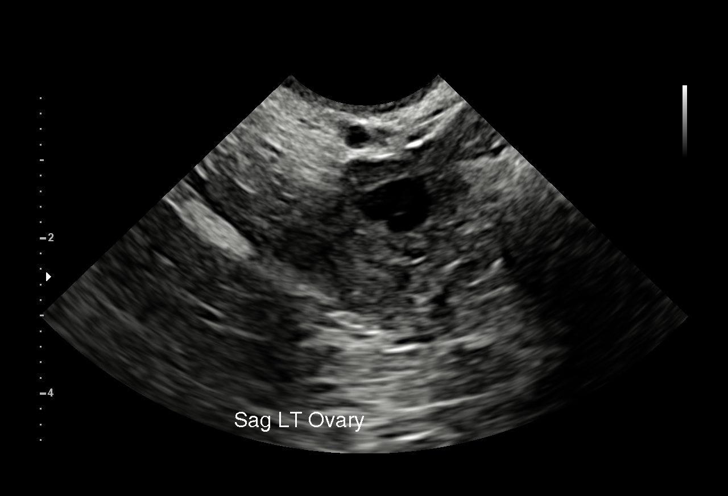
[im 39/46]
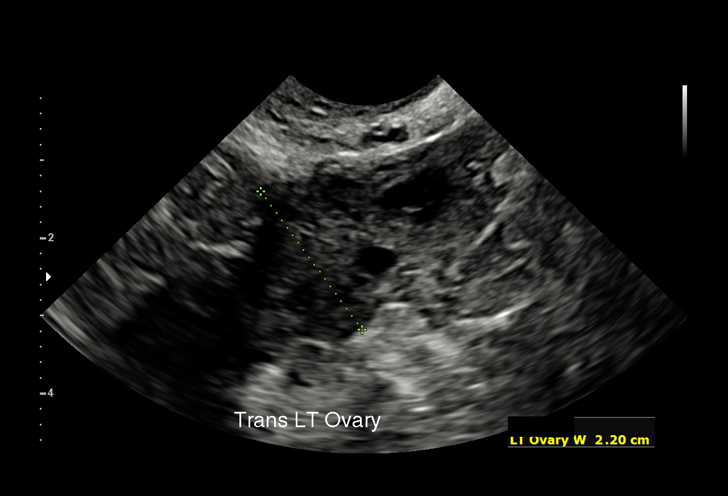
[im 42/46]
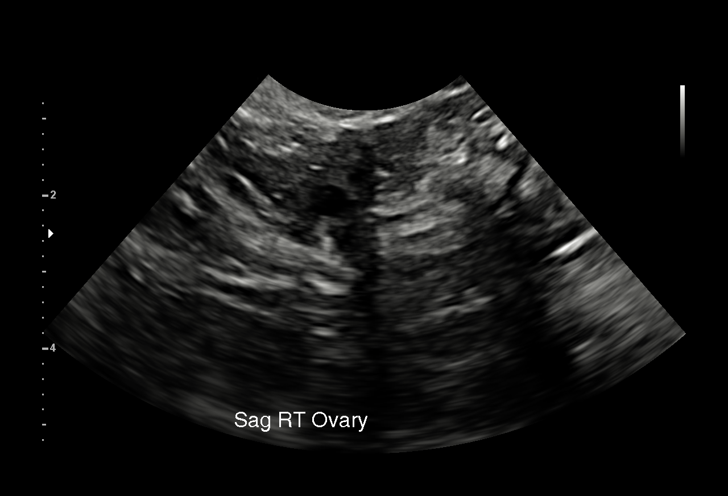
[im 46/46]
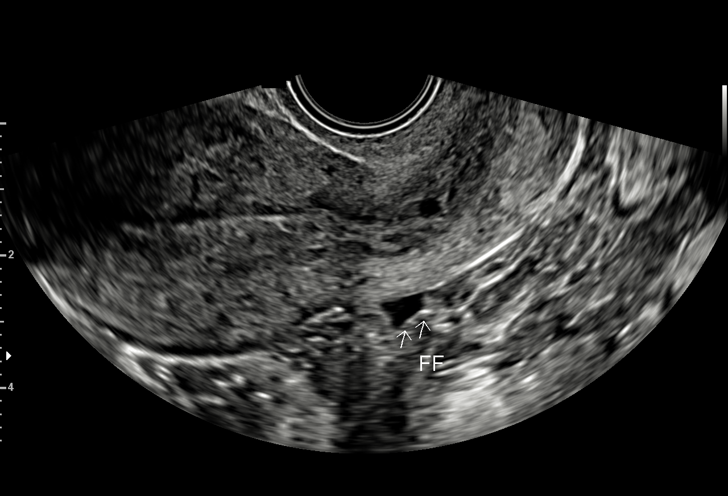

[15 of 28 positions shown; findings below may reference images not displayed]

FINDINGS: Intrauterine gestational sac: Not identified

Yolk sac:  Not identified

Embryo:  Not identified

Subchorionic hemorrhage:  None visualized.

Maternal uterus/adnexae: Normal ovaries. Potential corpus luteal
cyst of the LEFT ovary. Trace free fluid.
IMPRESSION: No intrauterine gestational sac, yolk sac, or fetal pole identified.
Differential considerations include intrauterine pregnancy too early
to be sonographically visualized, missed abortion, or ectopic
pregnancy. Followup ultrasound is recommended in 10-14 days for
further evaluation.

## 2023-10-26 NOTE — Telephone Encounter (Signed)
 Per provider, ok to change.

## 2023-10-26 NOTE — Telephone Encounter (Signed)
 Patient would like to switch from [facility] and have Marie Simpson as her pcp. She has an appointment on 11/10/2023 so if this is ok we can change it at that appointment.

## 2023-11-10 ENCOUNTER — Ambulatory Visit: Payer: BLUE CROSS/BLUE SHIELD | Attending: Family Health | Primary: Family Health

## 2023-11-14 ENCOUNTER — Ambulatory Visit
Admit: 2023-11-14 | Discharge: 2023-11-14 | Payer: BLUE CROSS/BLUE SHIELD | Attending: Family Health | Primary: Family Health

## 2023-11-14 ENCOUNTER — Encounter

## 2023-11-14 ENCOUNTER — Inpatient Hospital Stay: Admit: 2023-11-14 | Payer: BLUE CROSS/BLUE SHIELD | Primary: Family Health

## 2023-11-14 VITALS — BP 112/76 | HR 77 | Temp 98.00000°F | Ht 63.0 in | Wt 238.0 lb

## 2023-11-14 DIAGNOSIS — M5441 Lumbago with sciatica, right side: Secondary | ICD-10-CM

## 2023-11-14 MED ORDER — DICLOFENAC SODIUM 1 % EX GEL
1 | CUTANEOUS | 1 refills | Status: AC
Start: 2023-11-14 — End: ?

## 2023-11-14 NOTE — Patient Instructions (Signed)
 [place] [employer]*  (Call United Way/ 211 if need more resources.)   [employer] (formerly Sales executive)  What they offer: SNAP is used like cash to buy eligible food items from authorized retailers.  Apply for benefits online: StreamingLessons.se  Apply for benefits by phone: [phone] (M-F 8AM - 7PM; Sat 9AM - 12PM)  Feed More Hunger Hotline  What they offer:  The [facility] Hunger Hotline connects individuals in need of food with a local food pantry or program across 34 counties and cities in [place].   Website: https://feedmore.org/store-locator/ (search zip code)   Hunger Hotline Inquiry Form: https://feedmore.org/hunger-hotline/  Hunger Hotline Phone Number:  3252392902 x [phone] (M-F 9:00AM-4:00PM)    Meals on [employer] on Wheels is a program that delivers meals to individuals who have no reliable means for maintaining a healthy diet.  Services are provided by area.   [employer] Service Area:   Cities of [place], [place], [place], and [place].  Counties of [place], [place], [place], [place], [place], [place], [place], [place], [place], and [employer]  Website:  https://feedmore.org/how-we-help/meals-on-wheels/  To Apply:  Ages 72-59:  Apply online: https://feedmore.org/meals-on-wheels-application-form/  Apply by phone: [phone]  To Apply:  Ages 60+:  Apply Online: https://feedmore.org/meals-on-wheels-application-form/  Apply By Phone: [phone] (M-F 8:30AM-5PM)  For [place] of [place], [place], [place], [place], [place], [place], [place] and [place]: You may also apply by calling [employer] Resources on Aging: [phone]  For Cities of [place], [place], and [place] as well as [place] of [place], [place], [place], [place], and [place]:    Armed forces technical officer on Aging: [phone]    [employer] Service Area:   [place] of [place], [place], [place] & [place], [place],  [place], [place], [place], [place], [place] and [place].  Website: GoalForum.com.au  To Apply by phone: [phone]    [employer] ([facility]) area:   [place] of [place], [place], [place], [place], [place], [place], and [place].   Website: dirtdocter.com.html  For More Information: Call (778) 800-7359 or email meals@psraaa .org    Meals on Wheels [place]:   Website: http://gonzalez-rivas.net/  To Apply Online: SecuredTickets.se  To Apply by phone: [phone]    Meals on [facility]:    To Apply by Phone: [phone]    Other Resources:     [employer] Fresh Food via [employer]   What they offer: "[employer] is a nonprofit working together to build healthy communities by growing and [employer].  The food we grow is distributed through our network of programs and partnerships in communities where access to healthy food is limited."   Website: https://shalomfarms.org/find-fresh-food/  Phone: (604)848-9220   Cash, cards, and SNAP/EBT accepted        [employer]  What they offer: A nutrition midday meal and interaction with fellow community members.  There are 20 Friendship Cafes through the region including [place], and the [place] of [place], [place], [place], [place], [place], [place], and [place]  Website: https://seniorconnections-va.org/services/support-to-stay-home/friendship-cafes/  Phone: (331) 041-0727  Visit the website above to apply or call [employer] directly to have an application mailed to you.    Donation based fee system for meals   Days and times vary depending on location, but most are open from 9:30AM-1:00PM, 4 days per week, and are closed on Saturday, Sunday, and major holidays

## 2023-11-14 NOTE — Progress Notes (Signed)
 Chief Complaint   Patient presents with    Back Pain     Lower Right Side Down The Leg - Doing Icy Hot, Heating Pad - When she massages it it helps some     Constant Pain every Day      Have you been to the ER, urgent care clinic since your last visit?  Hospitalized since your last visit?   NO    Have you seen or consulted any other health care providers outside our system since your last visit?   NO     "Have you had a pap smear?"    YES - Where: [facility]  Nurse/CMA to request most recent records if not in the chart    No cervical cancer screening on file

## 2023-11-14 NOTE — Progress Notes (Signed)
 Marie Simpson (DOB:  1990/11/14) is a 33 y.o. female,Established patient, here for evaluation of the following chief complaint(s):  Back Pain (Lower Right Side Down The Leg - Doing Icy Hot, Heating Pad - When she massages it it helps some //Constant Pain every Day )         Assessment & Plan  Acute right-sided low back pain with right-sided sciatica    Likely radiculopathy. Trial topical diclofenac . Encouraged heat and ice application intermittently as tolerated. Continue with massage as tolerated.     Orders:    XR LUMBAR SPINE (2-3 VIEWS); Future    diclofenac  sodium (VOLTAREN ) 1 % GEL; Apply to affected area 4 times daily as needed.    Hair loss    Likely postpartum shedding, but will rule out thyroid abnormality.     Orders:    TSH; Future      Return as needed.       Subjective   [place] presents for a problem visit. Significant other and son are present with her. States that she is generally doing alright. She is 5 months PP, things are going well. Breastfeeding regularly, has great milk production. Has been experiencing significant hair shedding each time that she showers which has been frustrating. Greatest concern is that over the course of about two weeks she has been experiencing right sided low back pain that feels like a dull ache. Admits that sometimes shoots down her leg. Significant other has been massaging her back which does provide some relief. She is taking tylenol OTC as needed with some relief. She denies any injury. Has not tried any stretches. Admits that she has a history of poor body ergonomics, including lifting 70 pound dogs when she would bathe them.         Review of Systems   Constitutional: Negative.    Respiratory: Negative.     Cardiovascular: Negative.    Musculoskeletal:  Positive for back pain.   Neurological: Negative.         Past Medical History:   Diagnosis Date    ADHD (attention deficit hyperactivity disorder)     Anxiety     Depression     Irritable bowel syndrome      Manic depression (HCC)     Obesity      Current Outpatient Medications on File Prior to Visit   Medication Sig Dispense Refill    VITAMIN D PO Take by mouth      Cyanocobalamin (B-12 PO) Take by mouth      Ferrous Sulfate (IRON PO) Take by mouth      escitalopram (LEXAPRO) 5 MG tablet Take 1 tablet by mouth daily      MAGNESIUM PO Take by mouth      hydrOXYzine  HCl (ATARAX ) 25 MG tablet Take 1 tablet by mouth daily as needed for Itching (Patient not taking: Reported on 11/14/2023) 90 tablet 1    budesonide-formoterol (SYMBICORT) 160-4.5 MCG/ACT AERO Inhale 2 puffs into the lungs in the morning and at bedtime (Patient not taking: Reported on 11/14/2023)      dicyclomine  (BENTYL ) 20 MG tablet Take 1 tablet by mouth every 6 hours for 90 doses (Patient not taking: Reported on 11/14/2023) 90 tablet 0    omeprazole (PRILOSEC) 20 MG delayed release capsule Take 1 capsule by mouth daily (Patient not taking: Reported on 11/14/2023)       No current facility-administered medications on file prior to visit.         Objective  Physical Exam  Vitals reviewed.   Constitutional:       Appearance: She is well-groomed.   Cardiovascular:      Rate and Rhythm: Normal rate and regular rhythm.      Pulses: Normal pulses.      Heart sounds: Normal heart sounds.   Pulmonary:      Effort: Pulmonary effort is normal.      Breath sounds: Normal breath sounds.   Musculoskeletal:      Comments: Back free of edema, erythema, or obvious deformity ; +SLR to RT    Neurological:      Mental Status: She is alert and oriented to person, place, and time.      Cranial Nerves: Cranial nerves 2-12 are intact.   Psychiatric:      Comments: Pleasant             Vitals:    11/14/23 1500   BP: 112/76   Pulse: 77   Temp: 98 F (36.7 C)   SpO2: 97%           An electronic signature was used to authenticate this note.    --Fatima Honour, APRN - CNP

## 2023-11-15 LAB — TSH: TSH: 1.47 u[IU]/mL (ref 0.450–4.500)

## 2024-04-11 MED ORDER — VALACYCLOVIR HCL 1 G PO TABS
1 | ORAL_TABLET | ORAL | 0 refills | Status: AC
Start: 2024-04-11 — End: ?
